# Patient Record
Sex: Female | Born: 1950 | Race: Black or African American | Hispanic: No | Smoking: Current every day smoker
Health system: Southern US, Community
[De-identification: ages and names within clinical notes are randomized; demographics above are authoritative.]

## PROBLEM LIST (undated history)

## (undated) DIAGNOSIS — I499 Cardiac arrhythmia, unspecified: Secondary | ICD-10-CM

## (undated) DIAGNOSIS — I2510 Atherosclerotic heart disease of native coronary artery without angina pectoris: Secondary | ICD-10-CM

## (undated) DIAGNOSIS — M1990 Unspecified osteoarthritis, unspecified site: Secondary | ICD-10-CM

## (undated) DIAGNOSIS — K219 Gastro-esophageal reflux disease without esophagitis: Secondary | ICD-10-CM

## (undated) DIAGNOSIS — I10 Essential (primary) hypertension: Secondary | ICD-10-CM

## (undated) DIAGNOSIS — Z5189 Encounter for other specified aftercare: Secondary | ICD-10-CM

## (undated) DIAGNOSIS — F329 Major depressive disorder, single episode, unspecified: Secondary | ICD-10-CM

## (undated) DIAGNOSIS — G5600 Carpal tunnel syndrome, unspecified upper limb: Secondary | ICD-10-CM

## (undated) DIAGNOSIS — F32A Depression, unspecified: Secondary | ICD-10-CM

## (undated) DIAGNOSIS — Z9889 Other specified postprocedural states: Secondary | ICD-10-CM

## (undated) DIAGNOSIS — I509 Heart failure, unspecified: Secondary | ICD-10-CM

## (undated) DIAGNOSIS — C801 Malignant (primary) neoplasm, unspecified: Secondary | ICD-10-CM

## (undated) DIAGNOSIS — R112 Nausea with vomiting, unspecified: Secondary | ICD-10-CM

## (undated) DIAGNOSIS — IMO0001 Reserved for inherently not codable concepts without codable children: Secondary | ICD-10-CM

## (undated) HISTORY — PX: OTHER SURGICAL HISTORY: SHX169

## (undated) HISTORY — PX: BREAST SURGERY: SHX581

## (undated) HISTORY — PX: ABDOMINAL HYSTERECTOMY: SHX81

## (undated) HISTORY — PX: TOTAL KNEE ARTHROPLASTY: SHX125

## (undated) HISTORY — DX: Carpal tunnel syndrome, unspecified upper limb: G56.00

## (undated) HISTORY — PX: MASTECTOMY: SHX3

## (undated) HISTORY — PX: PORTACATH PLACEMENT: SHX2246

## (undated) SURGERY — ARTHROSCOPY, KNEE
Anesthesia: General | Laterality: Right

---

## 1997-08-24 ENCOUNTER — Encounter: Admission: RE | Admit: 1997-08-24 | Discharge: 1997-11-22 | Payer: Self-pay | Admitting: Orthopedic Surgery

## 1998-05-27 ENCOUNTER — Other Ambulatory Visit: Admission: RE | Admit: 1998-05-27 | Discharge: 1998-05-27 | Payer: Self-pay | Admitting: Radiology

## 1998-06-07 ENCOUNTER — Encounter (HOSPITAL_BASED_OUTPATIENT_CLINIC_OR_DEPARTMENT_OTHER): Payer: Self-pay | Admitting: General Surgery

## 1998-06-09 ENCOUNTER — Ambulatory Visit (HOSPITAL_COMMUNITY): Admission: RE | Admit: 1998-06-09 | Discharge: 1998-06-10 | Payer: Self-pay | Admitting: General Surgery

## 1998-06-24 ENCOUNTER — Encounter: Admission: RE | Admit: 1998-06-24 | Discharge: 1998-07-23 | Payer: Self-pay | Admitting: General Surgery

## 1998-07-22 ENCOUNTER — Other Ambulatory Visit: Admission: RE | Admit: 1998-07-22 | Discharge: 1998-07-22 | Payer: Self-pay | Admitting: *Deleted

## 1998-07-23 ENCOUNTER — Ambulatory Visit (HOSPITAL_COMMUNITY): Admission: RE | Admit: 1998-07-23 | Discharge: 1998-07-23 | Payer: Self-pay | Admitting: Oncology

## 1998-07-26 ENCOUNTER — Ambulatory Visit (HOSPITAL_COMMUNITY): Admission: RE | Admit: 1998-07-26 | Discharge: 1998-07-26 | Payer: Self-pay | Admitting: General Surgery

## 1998-07-26 ENCOUNTER — Encounter (HOSPITAL_BASED_OUTPATIENT_CLINIC_OR_DEPARTMENT_OTHER): Payer: Self-pay | Admitting: General Surgery

## 1998-10-20 ENCOUNTER — Encounter: Payer: Self-pay | Admitting: Oncology

## 1998-10-20 ENCOUNTER — Inpatient Hospital Stay (HOSPITAL_COMMUNITY): Admission: AD | Admit: 1998-10-20 | Discharge: 1998-10-22 | Payer: Self-pay | Admitting: Oncology

## 1999-01-07 ENCOUNTER — Encounter (HOSPITAL_COMMUNITY): Admission: RE | Admit: 1999-01-07 | Discharge: 1999-04-07 | Payer: Self-pay | Admitting: Oncology

## 1999-02-08 ENCOUNTER — Ambulatory Visit (HOSPITAL_COMMUNITY): Admission: RE | Admit: 1999-02-08 | Discharge: 1999-02-09 | Payer: Self-pay | Admitting: General Surgery

## 1999-03-29 ENCOUNTER — Encounter: Admission: RE | Admit: 1999-03-29 | Discharge: 1999-04-08 | Payer: Self-pay | Admitting: Orthopedic Surgery

## 1999-03-30 ENCOUNTER — Ambulatory Visit (HOSPITAL_COMMUNITY): Admission: RE | Admit: 1999-03-30 | Discharge: 1999-03-30 | Payer: Self-pay | Admitting: Cardiology

## 1999-04-06 ENCOUNTER — Encounter: Payer: Self-pay | Admitting: Cardiology

## 1999-04-06 ENCOUNTER — Encounter: Admission: RE | Admit: 1999-04-06 | Discharge: 1999-04-06 | Payer: Self-pay | Admitting: Cardiology

## 1999-04-11 ENCOUNTER — Ambulatory Visit (HOSPITAL_COMMUNITY): Admission: RE | Admit: 1999-04-11 | Discharge: 1999-04-11 | Payer: Self-pay | Admitting: Cardiology

## 1999-05-31 ENCOUNTER — Encounter: Payer: Self-pay | Admitting: Neurology

## 1999-05-31 ENCOUNTER — Ambulatory Visit (HOSPITAL_COMMUNITY): Admission: RE | Admit: 1999-05-31 | Discharge: 1999-05-31 | Payer: Self-pay | Admitting: Neurology

## 1999-06-15 ENCOUNTER — Encounter: Payer: Self-pay | Admitting: Neurology

## 1999-06-15 ENCOUNTER — Encounter: Admission: RE | Admit: 1999-06-15 | Discharge: 1999-06-15 | Payer: Self-pay | Admitting: Neurology

## 1999-08-08 ENCOUNTER — Other Ambulatory Visit: Admission: RE | Admit: 1999-08-08 | Discharge: 1999-08-08 | Payer: Self-pay | Admitting: Obstetrics and Gynecology

## 1999-10-04 ENCOUNTER — Encounter (HOSPITAL_COMMUNITY): Admission: RE | Admit: 1999-10-04 | Discharge: 2000-01-02 | Payer: Self-pay | Admitting: Dentistry

## 1999-10-24 ENCOUNTER — Encounter: Payer: Self-pay | Admitting: Oncology

## 1999-10-24 ENCOUNTER — Encounter: Admission: RE | Admit: 1999-10-24 | Discharge: 1999-10-24 | Payer: Self-pay | Admitting: Oncology

## 1999-11-14 ENCOUNTER — Encounter: Admission: RE | Admit: 1999-11-14 | Discharge: 2000-02-12 | Payer: Self-pay | Admitting: Family Medicine

## 2000-01-04 ENCOUNTER — Encounter (HOSPITAL_COMMUNITY): Admission: RE | Admit: 2000-01-04 | Discharge: 2000-04-03 | Payer: Self-pay | Admitting: Dentistry

## 2000-05-21 ENCOUNTER — Encounter (HOSPITAL_COMMUNITY): Payer: Self-pay | Admitting: Dentistry

## 2000-05-21 ENCOUNTER — Encounter (HOSPITAL_COMMUNITY): Admission: RE | Admit: 2000-05-21 | Discharge: 2000-08-19 | Payer: Self-pay | Admitting: Dentistry

## 2000-07-07 ENCOUNTER — Emergency Department (HOSPITAL_COMMUNITY): Admission: EM | Admit: 2000-07-07 | Discharge: 2000-07-08 | Payer: Self-pay | Admitting: *Deleted

## 2000-07-07 ENCOUNTER — Encounter: Payer: Self-pay | Admitting: *Deleted

## 2000-07-07 ENCOUNTER — Inpatient Hospital Stay (HOSPITAL_COMMUNITY): Admission: EM | Admit: 2000-07-07 | Discharge: 2000-07-10 | Payer: Self-pay | Admitting: Emergency Medicine

## 2000-07-13 ENCOUNTER — Encounter: Admission: RE | Admit: 2000-07-13 | Discharge: 2000-07-13 | Payer: Self-pay | Admitting: Oncology

## 2000-07-13 ENCOUNTER — Encounter: Payer: Self-pay | Admitting: Oncology

## 2000-07-13 ENCOUNTER — Ambulatory Visit (HOSPITAL_COMMUNITY): Admission: RE | Admit: 2000-07-13 | Discharge: 2000-07-13 | Payer: Self-pay | Admitting: Oncology

## 2000-10-02 ENCOUNTER — Encounter: Payer: Self-pay | Admitting: Cardiology

## 2000-10-02 ENCOUNTER — Inpatient Hospital Stay (HOSPITAL_COMMUNITY): Admission: AD | Admit: 2000-10-02 | Discharge: 2000-10-04 | Payer: Self-pay | Admitting: Cardiology

## 2001-03-01 ENCOUNTER — Encounter (HOSPITAL_COMMUNITY): Admission: RE | Admit: 2001-03-01 | Discharge: 2001-05-30 | Payer: Self-pay | Admitting: Dentistry

## 2001-11-05 ENCOUNTER — Encounter: Admission: RE | Admit: 2001-11-05 | Discharge: 2001-11-05 | Payer: Self-pay | Admitting: Family Medicine

## 2001-11-05 ENCOUNTER — Encounter: Payer: Self-pay | Admitting: Family Medicine

## 2002-08-28 ENCOUNTER — Emergency Department (HOSPITAL_COMMUNITY): Admission: EM | Admit: 2002-08-28 | Discharge: 2002-08-28 | Payer: Self-pay | Admitting: Emergency Medicine

## 2003-10-05 ENCOUNTER — Emergency Department (HOSPITAL_COMMUNITY): Admission: EM | Admit: 2003-10-05 | Discharge: 2003-10-06 | Payer: Self-pay | Admitting: Emergency Medicine

## 2003-12-10 ENCOUNTER — Ambulatory Visit (HOSPITAL_COMMUNITY): Admission: RE | Admit: 2003-12-10 | Discharge: 2003-12-10 | Payer: Self-pay | Admitting: Gastroenterology

## 2004-08-23 ENCOUNTER — Ambulatory Visit: Payer: Self-pay | Admitting: Oncology

## 2005-03-05 ENCOUNTER — Emergency Department (HOSPITAL_COMMUNITY): Admission: EM | Admit: 2005-03-05 | Discharge: 2005-03-05 | Payer: Self-pay | Admitting: Emergency Medicine

## 2005-04-29 ENCOUNTER — Encounter: Payer: Self-pay | Admitting: Vascular Surgery

## 2005-04-29 ENCOUNTER — Ambulatory Visit (HOSPITAL_COMMUNITY): Admission: RE | Admit: 2005-04-29 | Discharge: 2005-04-29 | Payer: Self-pay | Admitting: Family Medicine

## 2005-11-03 ENCOUNTER — Encounter: Admission: RE | Admit: 2005-11-03 | Discharge: 2005-11-03 | Payer: Self-pay | Admitting: Orthopedic Surgery

## 2005-11-20 ENCOUNTER — Encounter (HOSPITAL_COMMUNITY): Admission: RE | Admit: 2005-11-20 | Discharge: 2005-12-20 | Payer: Self-pay | Admitting: Orthopedic Surgery

## 2005-11-21 ENCOUNTER — Emergency Department (HOSPITAL_COMMUNITY): Admission: EM | Admit: 2005-11-21 | Discharge: 2005-11-21 | Payer: Self-pay | Admitting: Emergency Medicine

## 2005-12-07 ENCOUNTER — Inpatient Hospital Stay (HOSPITAL_COMMUNITY): Admission: RE | Admit: 2005-12-07 | Discharge: 2005-12-11 | Payer: Self-pay | Admitting: Orthopedic Surgery

## 2005-12-08 ENCOUNTER — Ambulatory Visit: Payer: Self-pay | Admitting: Physical Medicine & Rehabilitation

## 2007-10-31 ENCOUNTER — Emergency Department (HOSPITAL_COMMUNITY): Admission: EM | Admit: 2007-10-31 | Discharge: 2007-11-01 | Payer: Self-pay | Admitting: Emergency Medicine

## 2009-03-23 ENCOUNTER — Emergency Department (HOSPITAL_COMMUNITY): Admission: EM | Admit: 2009-03-23 | Discharge: 2009-03-23 | Payer: Self-pay | Admitting: Emergency Medicine

## 2009-06-07 ENCOUNTER — Inpatient Hospital Stay (HOSPITAL_COMMUNITY): Admission: EM | Admit: 2009-06-07 | Discharge: 2009-06-10 | Payer: Self-pay | Admitting: Emergency Medicine

## 2010-04-11 LAB — LIPID PANEL
Cholesterol: 177 mg/dL (ref 0–200)
HDL: 54 mg/dL (ref >39)
LDL Cholesterol: 113 mg/dL — ABNORMAL HIGH (ref 0–99)
Total CHOL/HDL Ratio: 3.3 RATIO
Triglycerides: 50 mg/dL (ref <150)

## 2010-04-11 LAB — DIFFERENTIAL
Basophils Absolute: 0 10*3/uL (ref 0.0–0.1)
Basophils Absolute: 0 10*3/uL (ref 0.0–0.1)
Basophils Relative: 0 % (ref 0–1)
Basophils Relative: 0 % (ref 0–1)
Eosinophils Absolute: 0 10*3/uL (ref 0.0–0.7)
Eosinophils Relative: 0 % (ref 0–5)
Lymphocytes Relative: 10 % — ABNORMAL LOW (ref 12–46)
Lymphocytes Relative: 17 % (ref 12–46)
Lymphs Abs: 1.7 10*3/uL (ref 0.7–4.0)
Monocytes Absolute: 0.1 10*3/uL (ref 0.1–1.0)
Monocytes Absolute: 0.5 10*3/uL (ref 0.1–1.0)
Monocytes Absolute: 0.8 10*3/uL (ref 0.1–1.0)
Monocytes Relative: 4 % (ref 3–12)
Neutro Abs: 10.4 10*3/uL — ABNORMAL HIGH (ref 1.7–7.7)
Neutro Abs: 13.6 10*3/uL — ABNORMAL HIGH (ref 1.7–7.7)
Neutrophils Relative %: 73 % (ref 43–77)
Neutrophils Relative %: 85 % — ABNORMAL HIGH (ref 43–77)
Neutrophils Relative %: 87 % — ABNORMAL HIGH (ref 43–77)

## 2010-04-11 LAB — CBC
HCT: 33.6 % — ABNORMAL LOW (ref 36.0–46.0)
HCT: 35.5 % — ABNORMAL LOW (ref 36.0–46.0)
Hemoglobin: 11.8 g/dL — ABNORMAL LOW (ref 12.0–15.0)
Hemoglobin: 12.3 g/dL (ref 12.0–15.0)
Hemoglobin: 12.4 g/dL (ref 12.0–15.0)
MCHC: 34.7 g/dL (ref 30.0–36.0)
MCHC: 35.2 g/dL (ref 30.0–36.0)
MCV: 91.3 fL (ref 78.0–100.0)
Platelets: 226 10*3/uL (ref 150–400)
Platelets: 229 10*3/uL (ref 150–400)
Platelets: 261 10*3/uL (ref 150–400)
RBC: 3.86 MIL/uL — ABNORMAL LOW (ref 3.87–5.11)
RBC: 3.87 MIL/uL (ref 3.87–5.11)
RBC: 3.96 MIL/uL (ref 3.87–5.11)
RDW: 15 % (ref 11.5–15.5)
WBC: 16 10*3/uL — ABNORMAL HIGH (ref 4.0–10.5)

## 2010-04-11 LAB — CARDIAC PANEL(CRET KIN+CKTOT+MB+TROPI)
CK, MB: 1.1 ng/mL (ref 0.3–4.0)
Relative Index: INVALID (ref 0.0–2.5)
Total CK: 63 U/L (ref 7–177)
Total CK: 65 U/L (ref 7–177)
Troponin I: 0.01 ng/mL (ref 0.00–0.06)
Troponin I: 0.01 ng/mL (ref 0.00–0.06)

## 2010-04-11 LAB — BASIC METABOLIC PANEL
BUN: 19 mg/dL (ref 6–23)
BUN: 22 mg/dL (ref 6–23)
CO2: 27 mEq/L (ref 19–32)
CO2: 31 mEq/L (ref 19–32)
Calcium: 9.1 mg/dL (ref 8.4–10.5)
Calcium: 9.5 mg/dL (ref 8.4–10.5)
Chloride: 103 mEq/L (ref 96–112)
Creatinine, Ser: 0.84 mg/dL (ref 0.4–1.2)
Creatinine, Ser: 1 mg/dL (ref 0.4–1.2)
GFR calc Af Amer: >60 mL/min (ref >60)
GFR calc Af Amer: >60 mL/min (ref >60)
GFR calc non Af Amer: 53 mL/min — ABNORMAL LOW (ref >60)
GFR calc non Af Amer: >60 mL/min (ref >60)
Glucose, Bld: 180 mg/dL — ABNORMAL HIGH (ref 70–99)
Potassium: 3.7 mEq/L (ref 3.5–5.1)
Sodium: 141 mEq/L (ref 135–145)

## 2010-04-11 LAB — GLUCOSE, CAPILLARY
Glucose-Capillary: 193 mg/dL — ABNORMAL HIGH (ref 70–99)
Glucose-Capillary: 270 mg/dL — ABNORMAL HIGH (ref 70–99)
Glucose-Capillary: 272 mg/dL — ABNORMAL HIGH (ref 70–99)
Glucose-Capillary: 308 mg/dL — ABNORMAL HIGH (ref 70–99)
Glucose-Capillary: 406 mg/dL — ABNORMAL HIGH (ref 70–99)
Glucose-Capillary: 460 mg/dL — ABNORMAL HIGH (ref 70–99)

## 2010-04-11 LAB — BRAIN NATRIURETIC PEPTIDE: Pro B Natriuretic peptide (BNP): <30 pg/mL (ref 0.0–100.0)

## 2010-04-11 LAB — MAGNESIUM: Magnesium: 2.1 mg/dL (ref 1.5–2.5)

## 2010-04-11 LAB — HEMOGLOBIN A1C
Hgb A1c MFr Bld: 7.7 % — ABNORMAL HIGH (ref <5.7)
Mean Plasma Glucose: 174 mg/dL — ABNORMAL HIGH (ref <117)

## 2010-04-11 LAB — GLUCOSE, RANDOM: Glucose, Bld: 403 mg/dL — ABNORMAL HIGH (ref 70–99)

## 2010-06-10 NOTE — Op Note (Signed)
[name] [name], [name] [name]               ACCOUNT NO.:  [id number]   MEDICAL RECORD NO.:  [id number]          PATIENT TYPE:  INP   LOCATION:  2550                         FACILITY:  [facility]   PHYSICIAN:  Myrtie Neither, MD      DATE OF BIRTH:  08/16/1950   DATE OF PROCEDURE:  12/07/2005  DATE OF DISCHARGE:                                 OPERATIVE REPORT   PREOPERATIVE DIAGNOSIS:  Severe degenerative arthritis left knee.   POSTOPERATIVE DIAGNOSIS:  Severe degenerative arthritis left knee.   ANESTHESIA:  General.   PROCEDURE:  Left total knee arthroplasty, Biomet implant.   The patient was taken to the operating room after being given adequate preop  medications.  Given general anesthesia and intubated.  The left knee was  prepped with DuraPrep and draped in sterile manner.  Tourniquet and Bovie  were used for hemostasis.  Anterior midline incision made from the tibial  tuberosity up to the quadriceps through the skin, going through the  subcutaneous, down to the fascia.  Sharp and blunt dissection were made both  medially about the capsule.  A medial paramedian incision was made into the  capsule, extending from the tibial tuberosity up to the quadriceps.  Due to  varus deformity, medial soft tissue release was necessary.  Patella was  reflected laterally.  The knee was taken up into a flexed position.  Osteophytes about the patella, femur, and tibia were resected.  The patient  had both deficiency of ACL as well as PCL.  After adequate soft tissue  release, with the knee in a flexed position, tibial cutting guide was put in  place, and 10 mm of tibial surface was resected.  Next, reaming down the  femoral canal was done. Distal femoral cutting jig was put in place, and  distal femoral cut was made.  Sizer was then put in place, and femoral size  was 65 mm.  Appropriate cutting jig for the 65 mm component was put in  place.  Anterior and posterior cuts and chamfering cuts were done.   Other  soft tissue resection was also done.  Due to deficiency of the PCL, a PCL-  sacrificing component was necessary, and the PCL cutting block was put in  place, and appropriate cut was made.  Next, trial component was put in place  and was found to fit very good and snug.  Attention was turned to the  proximal tibia, which was measured at 67 mm.  Alignment rod was used.  With  a 12 mm poly and the femoral component in place, full extension, full  flexion, landmarks were marked.  Then, appropriate cutting jigs were put in  place for the proximal tibia, and appropriate cuts were made.  Trial  components for femur and tibia were put in place, full extension, full  flexion, good medial and lateral stability.  A 12 mm poly was found to be  most stable.  Attention was then turned to the patella, which was sized at a  medium-size patella but very thin.  Appropriate cutting jig was put in  place, and  care was taken not to overcut the patella due to the thin size  which was 50 mm. A medium thin poly trial patella was put in place, and  range of motion was again tested, full extension, full flexion. No lateral  subluxation of the patella was noted.  Next, all components were removed.  Copious irrigation was done with the Simpulse irrigator. Methyl methacrylate  was mixed.  All three components were cemented.  Excess methyl methacrylate  was removed.  After setting of the cement, trial poly 12 mm was put back in  place, and again full flexion, full extension, good medial and lateral  stability, and without subluxation of the patella.  Hemostasis was then  obtained with the Bovie.  Further irrigation was done followed by locking of  the 12  mm poly in place. Wound closure was then done with 0 Vicryl for the fascia,  2-0 for the subcutaneous, and skin staples for the skin.  Bulky compressive  dressing was applied, and knee immobilizer applied.  The patient tolerated  the procedure quite well and  went to the recovery room in stable and  satisfactory condition.      Myrtie Neither, MD  Electronically Signed     AC/MEDQ  D:  12/07/2005  T:  12/07/2005  Job:  [id number]

## 2010-06-10 NOTE — Discharge Summary (Signed)
[place]. [facility]  Patient:    [name] [name], [name] [name]                      MRN: [id number] Adm. Date:  [id number] Disc. Date: 07/10/00 Attending:  Darlin Priestly Dictator:   Mancel Bale, P.A. CC:         Valentino Hue. Magrinat, M.D. with oncology   Discharge Summary  ADMISSION DIAGNOSES:  1. Congestive heart failure/pulmonary edema.  2. Atypical chest pain.  3. History of hypertension.  4. Non-insulin-dependent diabetes mellitus.  5. Irritable bowel syndrome.  6. History of left breast cancer status post left radical mastectomy.  7. History of Adriamycin - induced cardiomyopathy.  8. History of cardiac catheterization March 2001 that revealed normal     coronary arteries and ejection fraction 35%.  9. Ongoing tobacco use.  DISCHARGE DIAGNOSES:  1. Congestive heart failure/pulmonary edema, resolved.  2. Atypical chest pain.  3. Status post echocardiogram on July 09, 2000 which revealed overall left     ventricular systolic function mildly decreased.  Left ventricular     ejection fraction estimated 40-50% with mild hypokinesis of the     posterolateral wall.  There was no valvular abnormality on echocardiogram.     No other significant findings on echocardiogram.  4. History of hypertension.  5. Non-insulin-dependent diabetes mellitus.  6. Irritable bowel syndrome.  7. History of left breast cancer status post left radical mastectomy.  8. History of Adriamycin - induced cardiomyopathy.  9. History of cardiac catheterization March 2001 that revealed normal     coronary arteries and ejection fraction 35%. 10. Ongoing tobacco use.  HISTORY OF PRESENT ILLNESS:  [name] [name] is a 60 year old black female with a history of hypertension, non-insulin-dependent diabetes mellitus, irritable bowel syndrome, and a history of left breast cancer status post left mastectomy with presumed Adriamycin - induced LV dysfunction and history of EF of 35%.  She  presented to [facility] on July 07, 2000 with complaints of increased shortness of breath and dyspnea on exertion and persistent chest pain.  She had a history of cardiac catheterization March 2001 which revealed normal coronary arteries and EF 35%.  She had a negative Cardiolite prior to catheterization secondary to chest pain as well.  She continues to smoke one pack per day.  She presented with complaints of shortness of breath and dyspnea.  She stated that two weeks ago she started having increased dyspnea on exertion which had markedly worsened over the last three days.  She also had complaints of lower extremity edema, orthopnea, and PND.  At [facility] chest x-ray showed CHF and she was given IV Lasix with a greater than 2 liter diuresis and improved symptoms.  However, she continued to complain with chest pain with nonischemic EKG.  She was then transferred to [facility] for further evaluation.  At that point she was seen by Dr. Lenise Herald and was admitted for heart failure.  He planned for gentle diuresis, continue Coreg and ARB. He would add digoxin.  He would check cyclic enzymes but doubted that chest pain was cardiac in nature given normal coronaries one year ago.  He felt that she may need a repeat echo to assess LV function.  HOSPITAL COURSE:  On July 08, 2000 she continued to have complaints of chest wall pain that seemed positional in nature.  She describes it as a discomfort going from her right chest all the  way through to her back.  It does change when she changes position in the bed.  Thus far, the cardiac enzymes were negative.  We planned for a repeat echo on Monday and she was continued on gentle diuresis.  On July 09, 2000 she was continuing to have intermittent chest pain that continued to be positional and as described previously.  Her cardiac enzymes were negative x 3.  She awaited echocardiogram.  We had ordered a mammogram  to evaluate her to rule out any recurrent breast CA as the etiology of her chest pain but were informed that these are only done at [facility].  We then called Dr. Princella Pellegrini office for consultation regarding their input in evaluating this chest discomfort and ruling out any recurrent cancer as etiology of her chest pain.  Later on July 09, 2000 echocardiogram was performed and showed normal valvular structures.  It also showed EF to be 40-50% with mild hypokinesis of the posterolateral wall.  On July 10, 2000 it was felt that she was stable for discharge home from a cardiac standpoint.  Her lungs have been clear and she has had no lower extremity edema, and her echo shows that her EF actually is much improved over the last year, and there were no other significant findings on echo.  Her enzymes have remained negative and her EKGs remain without change.  I have spoken with Dr. Darnelle Catalan on the phone.  He does not feel that an inpatient consult is necessary and does not feel that any immediate studies need to be ordered.  Rather, he will plan to see her back in the office in one to two weeks and reevaluate her at that time, and review her chart and decide at that point what studies, if any, need to be performed regarding her chest pain.  HOSPITAL CONSULTS:  None.  HOSPITAL PROCEDURES:  Echocardiogram performed on July 09, 2000 shows left ventricular ejection fraction 40-50%.  Mild hypokinesis of the posterolateral wall.  Left ventricular wall thickness mildly increased.  Right ventricle mildly to moderately dilated.  Right ventricular systolic function mildly reduced.  There are no valvular abnormalities.  EKG shows normal sinus rhythm, nonspecific ST-T changes throughout the hospital.  There were never any ischemic or acute changes.  LABORATORY DATA:  Lipid profile July 08, 2000 shows cholesterol 180, triglyceride 121, HDL 46, LDL 110.  Cardiac enzymes show CKs of 82 and 70,  MB 0.3 and 0.7, troponin 0.01 x 2.  Metabolic profile is normal with sodium 144,  potassium 3.5, BUN 21, creatinine 0.7, chloride 109, CO2 29, glucose 113. LFTs are all normal with total protein 6.1, albumin 3.5, AST 14, ALT 12, alkaline phosphatase 55, total bili 0.5.  CBC also normal with wbcs 5.6, hemoglobin 11.9, hematocrit 35.2, platelets 226.  PT 13.1, INR 1.0.  DISCHARGE MEDICATIONS: 1. Avandia 4 mg once a day. 2. Prevacid 30 mg once a day. 3. Diovan HCTZ 160/12.5 once a day. 4. Celebrex 200 mg once a day. 5. Wellbutrin 150 mg twice a day. 6. Enteric-coated aspirin 325 mg once a day. 7. Coreg 12.5 mg twice a day. 8. Arimidex 1 mg as before. 9. Lasix 20 mg as needed for swelling or shortness of breath.  DIET:  We have reviewed a low salt diet.  Also low fat diabetic diet.  FOLLOW-UP: 1. She is to call Dr. Princella Pellegrini office to make an appointment to see him in    the next one to two weeks.  I  have explained the necessity for this and    she understands. 2. She has an appointment to follow up with Dr. Elsie Lincoln in the office July 5 at    2:30 p.m. DD:  07/10/00 TD:  07/10/00 Job: [id number]

## 2010-06-10 NOTE — Discharge Summary (Signed)
NAMEMACKINSEY, PELLAND               ACCOUNT NO.:  [id number]   MEDICAL RECORD NO.:  [id number]          PATIENT TYPE:  INP   LOCATION:  5014                         FACILITY:  [facility]   PHYSICIAN:  Myrtie Neither, MD      DATE OF BIRTH:  07/09/1950   DATE OF ADMISSION:  12/07/2005  DATE OF DISCHARGE:  12/11/2005                               DISCHARGE SUMMARY   ADMISSION DIAGNOSIS:  Severe degenerative arthropathy, left knee;  history of breast cancer; history of hypertension; past history of  asthma; history of reflux.   DISCHARGE DIAGNOSIS:  Severe degenerative arthropathy, left knee;  history of breast cancer; history of hypertension; past history of  asthma; history of reflux.   COMPLICATIONS:  None.   INFECTIONS:  None.   OPERATIONS:  Left total knee arthroplasty done 12/07/2005.   PERTINENT HISTORY:  This is a 60 year old female followed in the office  for severe degenerative joint disease of both knees, left knee being  worse than the right.  The patient had been treated with anti-  inflammatories, therapeutic injections and the use of a cane.  The  patient's condition was to the extent that the patient's left knee was  buckling and giving, swelling persistent pain, even with use of  medication.  Pertinent physical revealed left knee varus deformity, +2  effusion, unstable knee, both AP and lateral, crepitus all three  compartments negative Homan's test, range of motion was limited.  X-rays  revealed sclerosis both medial and lateral compartment and  patellofemoral joint.  The patient had preop laboratory, CBC, EKG, chest  x-ray, urinalysis, CMET, PT/PTT and platelet count which was found to be  stable enough for the patient to undergo surgery.  The patient had preop  medical evaluation by Renaye Rakers, M.D. that was found to be stable.  The patient underwent left total knee arthroplasty, tolerated procedure  quite well.  Postop course fairly benign.  Had pre and postop IV  antibiotics, used a CPM, home health and occupational therapy, physical  therapy.  The patient progressed well with ambulation with partial  weightbearing on the left side with use of walker and was able to be  discharged home on Percocet one to two by mouth every four hours as  needed for pain, home PT and CPM, continued postop Coumadin therapy.  The patient stable to be discharged to return to office in one week.      Myrtie Neither, MD  Electronically Signed     AC/MEDQ  D:  02/01/2006  T:  02/01/2006  Job:  [id number]

## 2010-06-10 NOTE — Op Note (Signed)
[name] [name], [name] [name]               ACCOUNT NO.:  [id number]   MEDICAL RECORD NO.:  [id number]          PATIENT TYPE:  AMB   LOCATION:  ENDO                         FACILITY:  [facility]   PHYSICIAN:  Jordan Hawks. Elnoria Howard, MD    DATE OF BIRTH:  1950/01/26   DATE OF PROCEDURE:  12/10/2003  DATE OF DISCHARGE:                                 OPERATIVE REPORT   PROCEDURE:  Colonoscopy.   INDICATIONS FOR PROCEDURE:  Screening colonoscopy.   CONSENT:  Informed consent was explained to the patient describing the risks  of bleeding, infection, perforation, medication reaction, 10% miss rate for  small colon cancer and polyps, the risks of death, all of which are not  exclusive of any other complications that may occur.   PHYSICAL EXAMINATION:  Cardiac:  Regular rate and rhythm.  Lungs clear to  auscultation bilaterally.  Abdomen:  Obese, soft, nontender, nondistended.   MEDICATIONS GIVEN:  Versed 5 mg IV and Demerol 40 mg IV and Phenergan 12.5  mg IV.   DESCRIPTION OF PROCEDURE:  The patient was placed in the left lateral  decubitus position and after adequate sedation was achieved, a rectal exam  was performed which was negative for any palpable abnormalities.  The  colonoscope was inserted from the anus and advanced to the cecum under  direct visualization without difficulty.  The patient was noted to have a  very good prep.  Upon slow withdrawal of the colonoscope, there is no  evidence of any masses, ulcerations, polyps, erosions, or vascular  abnormalities in the cecum, ascending colon, transverse, descending, or  sigmoid colon.  Close inspection did reveal that the patient did have  multiple small diverticula scattered throughout the colon, however, they are  mostly concentrated in the sigmoid colon.  With retroflexion in the rectum,  there is evidence of large internal and external hemorrhoids.  The  colonoscope was then straightened and withdrawn from the patient, no  complications were  encountered.  The patient tolerated the procedure well.   PLAN:  Repeat colonoscopy in ten years.       PDH/MEDQ  D:  12/10/2003  T:  12/10/2003  Job:  [id number]   cc:   Renaye Rakers, M.D.  [phone], [address]  [place]  [place] [address]  Fax: [phone]

## 2010-06-10 NOTE — Discharge Summary (Signed)
[place]. [facility]  Patient:    [name] [name], [name] [name] Visit Number: [id number] MRN: [id number]          Service Type: MED Location: [phone] Attending Physician:  Ophelia Shoulder Dictated by:   Marya Fossa, P.A. Admit Date:  10/02/2000 Disc. Date: 10/04/00   CC:         Geraldo Pitter, M.D.   Discharge Summary  DATE OF BIRTH:  03/15/1950.  [facility] MEDICAL RECORD NUMBER:  [id number].  ADMISSION DIAGNOSES: 1. Congestive heart failure. 2. Hypotension. 3. 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 4. Diabetes. 5. Arthritis.  DISCHARGE DIAGNOSES: 1. Congestive heart failure - compensated and stable. 2. Hypotension - resolved. 3. 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 4. Diabetes. 5. Arthritis. 6. Hypokalemia - repleted. 7. Sinus congestion - Being treated.  HISTORY OF PRESENT ILLNESS:  [name] [name] is a 60 year old African-American female with history of patent coronaries and Adriamycin induced cardiomyopathy who was referred to Madaline Savage, M.D., for hypotension with a blood pressure of 86/50 on September 27, 2000, by Geraldo Pitter, M.D.  Today, (October 01, 2000), she is short of breath with exertion and continues to have lower BP.  She has some fatigue and weakness.  She is mildly orthostatic in the office today with a lying pressure of 101/70, sitting 104/70, and standing 90/78.  Pulse 75.  Lungs have rales bilaterally. Extremities with trace lower extremity edema.  EKG showed sinus rhythm at rate of 75, and nonspecific T-wave changes.  The patient will be admitted to [facility]. [facility] for IV inotropic therapy.  Will follow labs.  She will need IV diuresis.  PROCEDURES:  None.  COMPLICATIONS:  None.  CONSULTATIONS:  None.  HOSPITAL COURSE:  [name] [name] was admitted to the hospital actually on October 02, 2000, for inotropic and diuretic therapy.  She  was treated with IV Lasix 40 mg q.12h. and IV dobutamine.  Lanoxin loading was starting.  Admission laboratory studies revealed the wbc of 5.4, hemoglobin 10.8, platelets 233.  INR 1.0. Potassium 3.1, BUN 19, creatinine 0.9.  LFTs within normal limits.  TSH 0.749.  The patients potassium was repleted.  A BNP level was drawn and was only 22. Digoxin level was 1.0 on October 03, 2000.  The patient improved symptomatology during her hospital stay.  Weight was down 0.13 pounds in the first 24 hours.  She had average urine output.  It was felt that the patient was only in early failure if anything, but improved on dobutamine symptomatically.  BNP level was not consistent with acute LV decompensation.  The patient did develop symptoms of sore throat and sinus congestion during the hospital stay. She also had a low grade temperature of 100.2 starting on October 03, 2000, which was down to 99 on October 04, 2000.  Throat cultures were negative for Strep. She had urine cultures which were negative. It is felt that she has a viral upper respiratory tract infection.  Dobutamine was weaned and discontinued on October 04, 2000.  Potassium was stable at 4.1, BUN 20, creatinine 0.7.  The patient was felt stable for discharge home on October 04, 2000.  DISCHARGE MEDICATIONS:  1. Claritin one a day until congestion improved then as needed.  2. Lanoxin 0.25 mg a day.  3. Lasix 40 mg a day.  4. Potassium 20 mEq a day.  4. Coreg 12.5 mg b.i.d.  5. Celebrex 200 mg b.i.d.  6.  Wellbutrin 150 mg b.i.d.  7. Avandia 4 mg a day.  8. Arimidex 1 mg a day.  9. Prevacid 30 mg a day. 10. Aspirin 325 a day. 11. Ativan, Darvocet, Neurontin, and glucophage as before. 12. Diovan 80 mg a day.  ACTIVITY:  As tolerated.  DIET:  No added salt.  Low fat, low cholesterol diabetic diet.  DISCHARGE INSTRUCTIONS:  Call the office if any problems or questions.  FOLLOW-UP:  A follow-up appointment has  been scheduled with Darcella Gasman. Annie Paras, F.N.P.C., on October 17, 2000, at 11 oclock.  We have given the patient a lab slip to have a BNP and digoxin level drawn in the middle of next week to make sure her potassium levels are stable and her digoxin levels are stable on this new Lanoxin. Dictated by:   Marya Fossa, P.A. Attending Physician:  Ophelia Shoulder DD:  10/04/00 TD:  10/04/00 Job: [id number]

## 2010-06-10 NOTE — Cardiovascular Report (Signed)
[place]. [name]  Patient:    [name] [name]                      MRN: [id number] Proc. Date: 04/11/99 Adm. Date:  [id number] Attending:  Ophelia Shoulder CC:         Cardiac Catheterization Laboratory             Geraldo Pitter, M.D.                        Cardiac Catheterization  PROCEDURE PERFORMED: 1. Selective coronary angiography by Judkins technique. 2. Retrograde left heart catheterization. 3. Left ventricular angiography.  COMPLICATIONS:  None.  ENTRY SITE:  Right femoral.  DYE USED:  Omnipaque.  MEDICATIONS GIVEN PRIOR TO CATHETERIZATION LABORATORY ENTRY:  The patient was given contrast dye allergy prophylaxis in the form of Solu-Medrol 60 mg IV, Peptic 20 mg IV, and Benadryl 25 mg IV.  PATIENT PROFILE:  The patient is a 60 year old woman who has had shortness of breath, chest pain, left ventricular dysfunction, probably related to Adriamycin cardiotoxicity, who has recently had an outpatient nuclear medicine stress test  showing a depressed left ventricular ejection fraction of 42%, and no definite evidence of ischemia.  Because of continued chest pain, however, despite the use of Zantac and Celebrex, the patient was brought to the cardiac catheterization laboratory for diagnostic cardiac catheterization.  RESULTS: Left ventricular pressure:  150/25. Central aortic pressure:  150/95, with a mean of 115.  ANGIOGRAPHIC RESULTS: 1. Left main coronary artery:  The left main coronary artery was widely patent    and normal. 2. Left anterior descending coronary artery:  The left anterior descending coursed    to the cardiac apex and gave rise to a septal perforator branch very near    the origin of the left main itself.  A diagonal branch arose in the midportion    of the vessel.  No lesions were seen in the LAD, diagonal, or septal    perforator branch. 3. Left circumflex coronary artery:  The left circumflex was  nondominant.    It gave rise to one very large intermediate ramus branch which bifurcated    distally, then two smaller obtuse marginal branches.  All branches of the    circumflex and ramus were patent and normal. 4. Right coronary artery:  The right coronary artery contained what was a large    and dominant vessel, giving rise to both the posterior descending and the    posterior lateral branch.  No lesions were seen. 5. Left ventricle:  Showed mild left ventricular dilatation and global    hypokinesis, with an ejection fraction estimated at 35% plus or minus 5%.  FINAL DIAGNOSES: 1. Angiographic patent coronary arteries. 2. Adriamycin cardiotoxicity with dilated cardiomyopathy, left ventricular    ejection fraction of 35%.  PLAN:  The patient should be treated with ACE inhibitors for her left ventricular dysfunction, diuretics when needed.  Beta blockers will be poorly tolerated because of her asthma.  She does not have a clinical indication for digitalis at the current time.  She should be reassured with respect to the chest pain she has been having.  Her shortness of breath is likely attributable to her cardiomyopathy. DD:  04/11/99 TD:  04/11/99 Job: [id number]

## 2010-10-24 LAB — COMPREHENSIVE METABOLIC PANEL
ALT: 14
AST: 14
Albumin: 3.5
Alkaline Phosphatase: 84
Chloride: 108
GFR calc Af Amer: >60
Potassium: 3.2 — ABNORMAL LOW
Total Bilirubin: 0.5

## 2010-10-24 LAB — DIFFERENTIAL
Basophils Absolute: 0
Basophils Relative: 0
Eosinophils Relative: 4
Monocytes Absolute: 0.5

## 2010-10-24 LAB — CBC
HCT: 37.3
Platelets: 253
WBC: 7.7

## 2011-04-07 ENCOUNTER — Other Ambulatory Visit: Payer: Self-pay

## 2011-04-07 ENCOUNTER — Encounter (HOSPITAL_COMMUNITY): Payer: Self-pay | Admitting: *Deleted

## 2011-04-07 ENCOUNTER — Emergency Department (HOSPITAL_COMMUNITY): Payer: Medicare Other

## 2011-04-07 ENCOUNTER — Emergency Department (HOSPITAL_COMMUNITY)
Admission: EM | Admit: 2011-04-07 | Discharge: 2011-04-07 | Disposition: A | Discharge status: Home / Self Care | Payer: Medicare Other | Attending: Emergency Medicine | Admitting: Emergency Medicine

## 2011-04-07 DIAGNOSIS — R05 Cough: Secondary | ICD-10-CM | POA: Diagnosis not present

## 2011-04-07 DIAGNOSIS — R0602 Shortness of breath: Secondary | ICD-10-CM | POA: Diagnosis not present

## 2011-04-07 DIAGNOSIS — R000 Tachycardia, unspecified: Secondary | ICD-10-CM | POA: Insufficient documentation

## 2011-04-07 DIAGNOSIS — R079 Chest pain, unspecified: Secondary | ICD-10-CM | POA: Insufficient documentation

## 2011-04-07 DIAGNOSIS — R0989 Other specified symptoms and signs involving the circulatory and respiratory systems: Secondary | ICD-10-CM | POA: Diagnosis not present

## 2011-04-07 DIAGNOSIS — F17200 Nicotine dependence, unspecified, uncomplicated: Secondary | ICD-10-CM | POA: Insufficient documentation

## 2011-04-07 DIAGNOSIS — R002 Palpitations: Secondary | ICD-10-CM | POA: Insufficient documentation

## 2011-04-07 DIAGNOSIS — R059 Cough, unspecified: Secondary | ICD-10-CM | POA: Insufficient documentation

## 2011-04-07 HISTORY — DX: Atherosclerotic heart disease of native coronary artery without angina pectoris: I25.10

## 2011-04-07 HISTORY — DX: Unspecified osteoarthritis, unspecified site: M19.90

## 2011-04-07 HISTORY — DX: Malignant (primary) neoplasm, unspecified: C80.1

## 2011-04-07 HISTORY — DX: Heart failure, unspecified: I50.9

## 2011-04-07 HISTORY — DX: Essential (primary) hypertension: I10

## 2011-04-07 LAB — BASIC METABOLIC PANEL
BUN: 12 mg/dL (ref 6–23)
Calcium: 9.7 mg/dL (ref 8.4–10.5)
Creatinine, Ser: 0.78 mg/dL (ref 0.50–1.10)
GFR calc Af Amer: >90 mL/min (ref >90)
GFR calc non Af Amer: 88 mL/min — ABNORMAL LOW (ref >90)
Glucose, Bld: 154 mg/dL — ABNORMAL HIGH (ref 70–99)
Potassium: 3.3 mEq/L — ABNORMAL LOW (ref 3.5–5.1)

## 2011-04-07 LAB — CBC
Hemoglobin: 13.7 g/dL (ref 12.0–15.0)
MCH: 31 pg (ref 26.0–34.0)
MCHC: 34.3 g/dL (ref 30.0–36.0)
MCV: 90.5 fL (ref 78.0–100.0)

## 2011-04-07 LAB — DIFFERENTIAL
Basophils Relative: 0 % (ref 0–1)
Eosinophils Absolute: 0 10*3/uL (ref 0.0–0.7)
Eosinophils Relative: 1 % (ref 0–5)
Monocytes Relative: 15 % — ABNORMAL HIGH (ref 3–12)
Neutrophils Relative %: 64 % (ref 43–77)

## 2011-04-07 LAB — POCT I-STAT TROPONIN I

## 2011-04-07 MED ORDER — SODIUM CHLORIDE 0.9 % IV BOLUS (SEPSIS)
500.0000 mL | Freq: Once | INTRAVENOUS | Status: AC
  Administered 2011-04-07: 500 mL via INTRAVENOUS

## 2011-04-07 MED ORDER — METOPROLOL TARTRATE 1 MG/ML IV SOLN
INTRAVENOUS | Status: AC
  Administered 2011-04-07: 5 mg via INTRAVENOUS
  Filled 2011-04-07: qty 15

## 2011-04-07 MED ORDER — ATENOLOL 25 MG PO TABS: 50.0000 mg | ORAL_TABLET | Freq: Every day | ORAL | Status: DC

## 2011-04-07 MED ORDER — BENZONATATE 100 MG PO CAPS: 100.0000 mg | ORAL_CAPSULE | Freq: Three times a day (TID) | ORAL | Status: AC

## 2011-04-07 MED ORDER — METOPROLOL TARTRATE 1 MG/ML IV SOLN
5.0000 mg | Freq: Once | INTRAVENOUS | Status: DC
  Administered 2011-04-07: 5 mg via INTRAVENOUS

## 2011-04-07 NOTE — ED Notes (Signed)
Pt states that her heart has been racing off and on since yesterday. Pt states that she feels really bad. Pt c/o some shortness of breath. States that her chest feels tight. States that her MD put her on Benicar HCT on Wednesday. Did not take it today.

## 2011-04-07 NOTE — Discharge Instructions (Signed)
Your chest x-ray does not show pneumonia.  Your EKG, and blood tests do not show any signs of a heart attack.  You do not have evidence of a blood clot.  Use Tessalon for cough.  Stop smoking.  Use atenolol to control your heart rate, and blood pressure.  Stop your Benicar.  Followup with your physician, for reevaluation.  Return for worse or uncontrolled symptoms

## 2011-04-07 NOTE — ED Notes (Signed)
Pt showing NSR with rate 82 on cardiac monitor. Pt denies pain at this time.

## 2011-04-07 NOTE — ED Provider Notes (Signed)
This chart was scribed for [name] Guppy, MD by Williemae Natter. The patient was seen in room APA14/APA14 at 10:45 AM.  CSN: [id number]  Arrival date & time 04/07/11  1021   First MD Initiated Contact with Patient 04/07/11 1043      Chief Complaint  Patient presents with  . Palpitations    (Consider location/radiation/quality/duration/timing/severity/associated sxs/prior treatment) Patient is a 61 y.o. female presenting with palpitations.  Palpitations  This is a new problem. The current episode started 6 to 12 hours ago. The problem occurs constantly. The problem has been gradually improving. The problem is associated with an unknown factor. Associated symptoms include chest pressure and cough. Pertinent negatives include no fever, no chest pain, no irregular heartbeat, no nausea, no vomiting, no leg pain, no weakness, no shortness of breath and no sputum production. She has tried nothing for the symptoms.   [name] [name] is a 61 y.o. female who presents to the Emergency Department complaining of a rapid heart rate that started yesterday. Pt says that she is not feeling well. Pt reports that she got up several times in the night to check pulse and found that it was elevated to about 190. She denies pain anywhere. Pt reports some chest tightness rated a 7/10. Pt denies fever, nausea, vomiting, diarrhea, light headedness, shortness of breath, blood in stool, bleeding from gums, no leg swelling, and any recent travel. Pt has not had any recent surgery. Pt has a non productive cough. Pt is a smoker.    No past medical history on file.  No past surgical history on file.  No family history on file.  History  Substance Use Topics  . Smoking status: Not on file  . Smokeless tobacco: Not on file  . Alcohol Use: Not on file    OB History    No data available      Review of Systems  Constitutional: Negative for fever.  Respiratory: Positive for cough. Negative for sputum production  and shortness of breath.   Cardiovascular: Positive for palpitations. Negative for chest pain.  Gastrointestinal: Negative for nausea and vomiting.  Neurological: Negative for weakness.   10 Systems reviewed and are negative for acute change except as noted in the HPI.  Allergies  Review of patient's allergies indicates not on file.  Home Medications  No current outpatient prescriptions on file.  BP 100/75  Pulse 174  Temp(Src) 98.4 F (36.9 C) (Oral)  Resp 20  SpO2 93%  Physical Exam  Nursing note and vitals reviewed. Constitutional: She is oriented to person, place, and time. She appears well-developed and well-nourished.  HENT:  Head: Normocephalic and atraumatic.  Eyes: Conjunctivae are normal. Pupils are equal, round, and reactive to light.  Neck: Normal range of motion. Neck supple.  Cardiovascular: Normal heart sounds.  Tachycardia present.   Pulmonary/Chest: Effort normal and breath sounds normal.  Musculoskeletal: Normal range of motion. She exhibits no tenderness.  Neurological: She is alert and oriented to person, place, and time.  Skin: Skin is warm and dry.  Psychiatric: She has a normal mood and affect. Her behavior is normal.    ED Course  Procedures (including critical care time) 61 year old, female, with a history of hypertension, who smokes cigarettes, presents emergency department with a rapid heartbeat.  Nonproductive cough, and not feeling well since last night.  She does have chest tightness, which I think is attributable to her rapid heartbeat.  The chest tightness resolved with 5 mg of Lopressor.  She has  no risk factors for a pulmonary embolism.  Although she did have a history of breast cancer, which was treated 12 years ago.  I will perform a chest x-ray, since she is a nonproductive cough, looking for pneumonia, as well as laboratory testing, including a d-dimer, and cardiac enzymes, to try to determine an etiology for her tachycardia. DIAGNOSTIC  STUDIES: Oxygen Saturation is 93% on room air, adequate by my interpretation.    COORDINATION OF CARE:  Medications  sodium chloride 0.9 % bolus 500 mL (500 mL Intravenous Given 04/07/11 1046)  metoprolol (LOPRESSOR) 1 MG/ML injection (5 mg Intravenous Given 04/07/11 1044)      Labs Reviewed  POCT I-STAT TROPONIN I  CBC  DIFFERENTIAL  BASIC METABOLIC PANEL  D-DIMER, QUANTITATIVE   No results found.   No diagnosis found.  10:58 AM Pt was given 5 mg of lopressor and heart rate has dropped to 80. Pt is feeling better.  ED ECG REPORT   Date: 04/07/2011  EKG Time: 11:48 AM  Rate: 172  Rhythm: sinus tachycardia,    Axis: left  Intervals:none  ST&T Change: nonspecific  Narrative Interpretation: st with nonspecific st changes  ED ECG REPORT   Date: 04/07/2011  EKG Time: 11:49 AM  Rate: 80  Rhythm: normal sinus rhythm,  Rate slower  Axis: left  Intervals:none  ST&T Change: nonspecific tw changes  Narrative Interpretation: nsr with nonspecific tw changes  ( rate decreased after lopressor )     12:33 PM sxs still resolved                 MDM  Tachycardia - resolved. No signs acs or pe. No pneumonia. No recent illness. No pain.   I personally performed the services described in this documentation, which was scribed in my presence. The recorded information has been reviewed and considered.        [name] Guppy, MD 04/07/11 1234

## 2011-04-07 NOTE — ED Notes (Signed)
Pt converted to NSR after Metoprolol given. Pt states that she feels better and is no longer having chest tightness.

## 2011-04-17 ENCOUNTER — Encounter (HOSPITAL_COMMUNITY): Payer: Self-pay | Admitting: Pharmacy Technician

## 2011-04-17 ENCOUNTER — Other Ambulatory Visit: Payer: Self-pay | Admitting: Orthopedic Surgery

## 2011-04-18 ENCOUNTER — Encounter (HOSPITAL_COMMUNITY)
Admission: RE | Admit: 2011-04-18 | Discharge: 2011-04-18 | Disposition: A | Discharge status: Home / Self Care | Payer: Medicare Other | Source: Ambulatory Visit | Attending: Orthopedic Surgery | Admitting: Orthopedic Surgery

## 2011-04-18 ENCOUNTER — Encounter (HOSPITAL_COMMUNITY): Payer: Self-pay

## 2011-04-18 DIAGNOSIS — Z01818 Encounter for other preprocedural examination: Secondary | ICD-10-CM | POA: Insufficient documentation

## 2011-04-18 DIAGNOSIS — Z538 Procedure and treatment not carried out for other reasons: Secondary | ICD-10-CM | POA: Insufficient documentation

## 2011-04-18 DIAGNOSIS — Z01811 Encounter for preprocedural respiratory examination: Secondary | ICD-10-CM | POA: Diagnosis not present

## 2011-04-18 DIAGNOSIS — Z01812 Encounter for preprocedural laboratory examination: Secondary | ICD-10-CM | POA: Insufficient documentation

## 2011-04-18 HISTORY — DX: Cardiac arrhythmia, unspecified: I49.9

## 2011-04-18 HISTORY — DX: Encounter for other specified aftercare: Z51.89

## 2011-04-18 HISTORY — DX: Major depressive disorder, single episode, unspecified: F32.9

## 2011-04-18 HISTORY — DX: Other specified postprocedural states: Z98.890

## 2011-04-18 HISTORY — DX: Nausea with vomiting, unspecified: R11.2

## 2011-04-18 HISTORY — DX: Depression, unspecified: F32.A

## 2011-04-18 HISTORY — DX: Reserved for inherently not codable concepts without codable children: IMO0001

## 2011-04-18 HISTORY — DX: Gastro-esophageal reflux disease without esophagitis: K21.9

## 2011-04-18 LAB — COMPREHENSIVE METABOLIC PANEL
CO2: 24 mEq/L (ref 19–32)
Calcium: 9.3 mg/dL (ref 8.4–10.5)
Creatinine, Ser: 0.69 mg/dL (ref 0.50–1.10)
GFR calc Af Amer: >90 mL/min (ref >90)
GFR calc non Af Amer: >90 mL/min (ref >90)
Glucose, Bld: 112 mg/dL — ABNORMAL HIGH (ref 70–99)
Total Protein: 6.5 g/dL (ref 6.0–8.3)

## 2011-04-18 LAB — CBC
Hemoglobin: 12.8 g/dL (ref 12.0–15.0)
MCH: 31.3 pg (ref 26.0–34.0)
MCHC: 33.5 g/dL (ref 30.0–36.0)
MCV: 93.4 fL (ref 78.0–100.0)
RBC: 4.09 MIL/uL (ref 3.87–5.11)

## 2011-04-18 LAB — URINALYSIS, ROUTINE W REFLEX MICROSCOPIC
Leukocytes, UA: NEGATIVE
Nitrite: NEGATIVE
Specific Gravity, Urine: 1.031 — ABNORMAL HIGH (ref 1.005–1.030)
Urobilinogen, UA: 1 mg/dL (ref 0.0–1.0)

## 2011-04-18 NOTE — Pre-Procedure Instructions (Signed)
20 [name] [name]   04/18/2011   Your procedure is scheduled on:  Thursday, April 4TH    Report to [facility] at  5:30 AM.   Call this number if you have problems the morning of surgery: [phone]   Remember:   Do not eat food:After Midnight Wednesday .  May have clear liquids: up to 4 Hours before arrival time...1:30 AM.  Clear liquids include soda, tea, black coffee, apple or grape juice, broth.   Take these medicines the morning of surgery with A SIP OF WATER:  COREG,               LORCET, ZANTAC   Do not wear jewelry, make-up or nail polish.   Do not wear lotions, powders, or perfumes. You may wear deodorant.   Do not shave 48 hours prior to surgery.   Do not bring valuables to the hospital.   Contacts, dentures or bridgework may not be worn into surgery.  Leave suitcase in the car. After surgery it may be brought to your room.  For patients admitted to the hospital, checkout time is 11:00 AM the day of discharge.   Patients discharged the day of surgery will not be allowed to drive home.  Name and phone number of your driver:            Special Instructions: CHG Shower Use Special Wash: 1/2 bottle night before surgery and 1/2 bottle morning of surgery.   Please read over the following fact sheets that you were given: Pain Booklet, Coughing and Deep Breathing, MRSA Information and Surgical Site Infection Prevention

## 2011-04-18 NOTE — Progress Notes (Addendum)
ACOUPLE OF WEEKS AGO, PT VISITED [facility], D/T HEARTRATE BEING AROUND [phone]....WAS GIVEN LOPRESSOR IV IN THE ER...WAS GIVEN ATENOLOL BY  ER  MD....BUT THEN SHE WENT BACK TO DR BLAND WHO THEN PUT HER ON DIOVAN....NO FURTHER INCIDENTS OF TACHYCARDIA.  1055 Tuesday...DURING PAT APPT., PT STATED SHE HAD SOME PROBLEMS OBTAINING MEDICATIONS D/T COST....I HAVE CALLED DR CARTER'S OFFICE TO NOTIFY HIM THAT AN ORDER FOR CASE MANAGEMENT CONSULT WILL BE NEEDED...NOTE PLACED IN FRONT OF PERMIT ALSO...DA  NO BLOOD DRAWS OR BP'S TO BE TAKEN ON LEFT ARM.Marland KitchenMarland KitchenAPPROPRIATE WRIST BAND PLACED INSIDE CHART....DA

## 2011-04-26 MED ORDER — CEFAZOLIN SODIUM-DEXTROSE 2-3 GM-% IV SOLR: 2.0000 g | INTRAVENOUS | Status: DC

## 2011-04-26 MED ORDER — CHLORHEXIDINE GLUCONATE 4 % EX LIQD: 60.0000 mL | Freq: Once | CUTANEOUS | Status: DC

## 2011-04-27 ENCOUNTER — Encounter (HOSPITAL_COMMUNITY): Admission: RE | Payer: Self-pay | Source: Ambulatory Visit

## 2011-04-27 ENCOUNTER — Ambulatory Visit (HOSPITAL_COMMUNITY)
Admission: RE | Admit: 2011-04-27 | Payer: PRIVATE HEALTH INSURANCE | Source: Ambulatory Visit | Admitting: Orthopedic Surgery

## 2011-04-27 SURGERY — ARTHROSCOPY, KNEE
Anesthesia: General | Laterality: Right

## 2011-08-17 ENCOUNTER — Ambulatory Visit (HOSPITAL_COMMUNITY)
Admission: RE | Admit: 2011-08-17 | Discharge: 2011-08-17 | Disposition: A | Discharge status: Home / Self Care | Payer: Medicare Other | Source: Ambulatory Visit | Attending: Family Medicine | Admitting: Family Medicine

## 2011-08-17 ENCOUNTER — Other Ambulatory Visit (HOSPITAL_COMMUNITY): Payer: Self-pay | Admitting: *Deleted

## 2011-08-17 DIAGNOSIS — Z0271 Encounter for disability determination: Secondary | ICD-10-CM

## 2011-08-17 DIAGNOSIS — Z96659 Presence of unspecified artificial knee joint: Secondary | ICD-10-CM | POA: Diagnosis not present

## 2011-08-17 DIAGNOSIS — M25569 Pain in unspecified knee: Secondary | ICD-10-CM | POA: Diagnosis not present

## 2011-08-17 DIAGNOSIS — R937 Abnormal findings on diagnostic imaging of other parts of musculoskeletal system: Secondary | ICD-10-CM | POA: Insufficient documentation

## 2011-08-17 DIAGNOSIS — M1710 Unilateral primary osteoarthritis, unspecified knee: Secondary | ICD-10-CM | POA: Diagnosis not present

## 2012-07-29 ENCOUNTER — Encounter: Payer: Self-pay | Admitting: Internal Medicine

## 2013-02-01 IMAGING — CR DG KNEE COMPLETE 4+V*L*
4 series · 4 of 4 positions shown · non-contrast
Comparison: 11/03/2005

CLINICAL DATA: Bilateral knee pain.  History of left knee
replacement.

LEFT KNEE - COMPLETE 4+ VIEW

[view not recorded (1 of 4)]
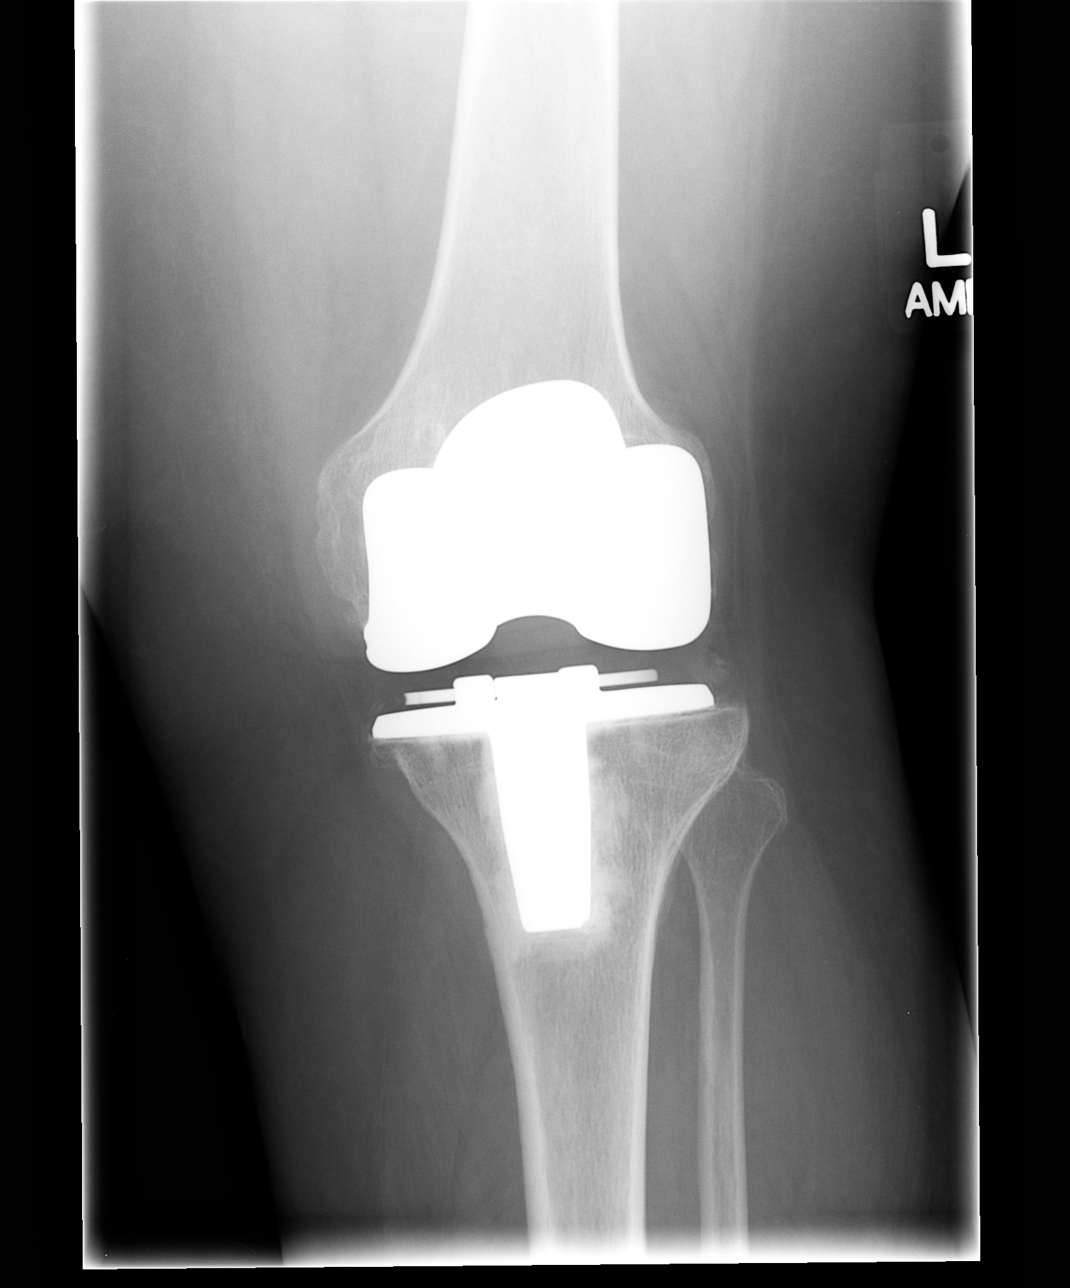

[view not recorded (2 of 4)]
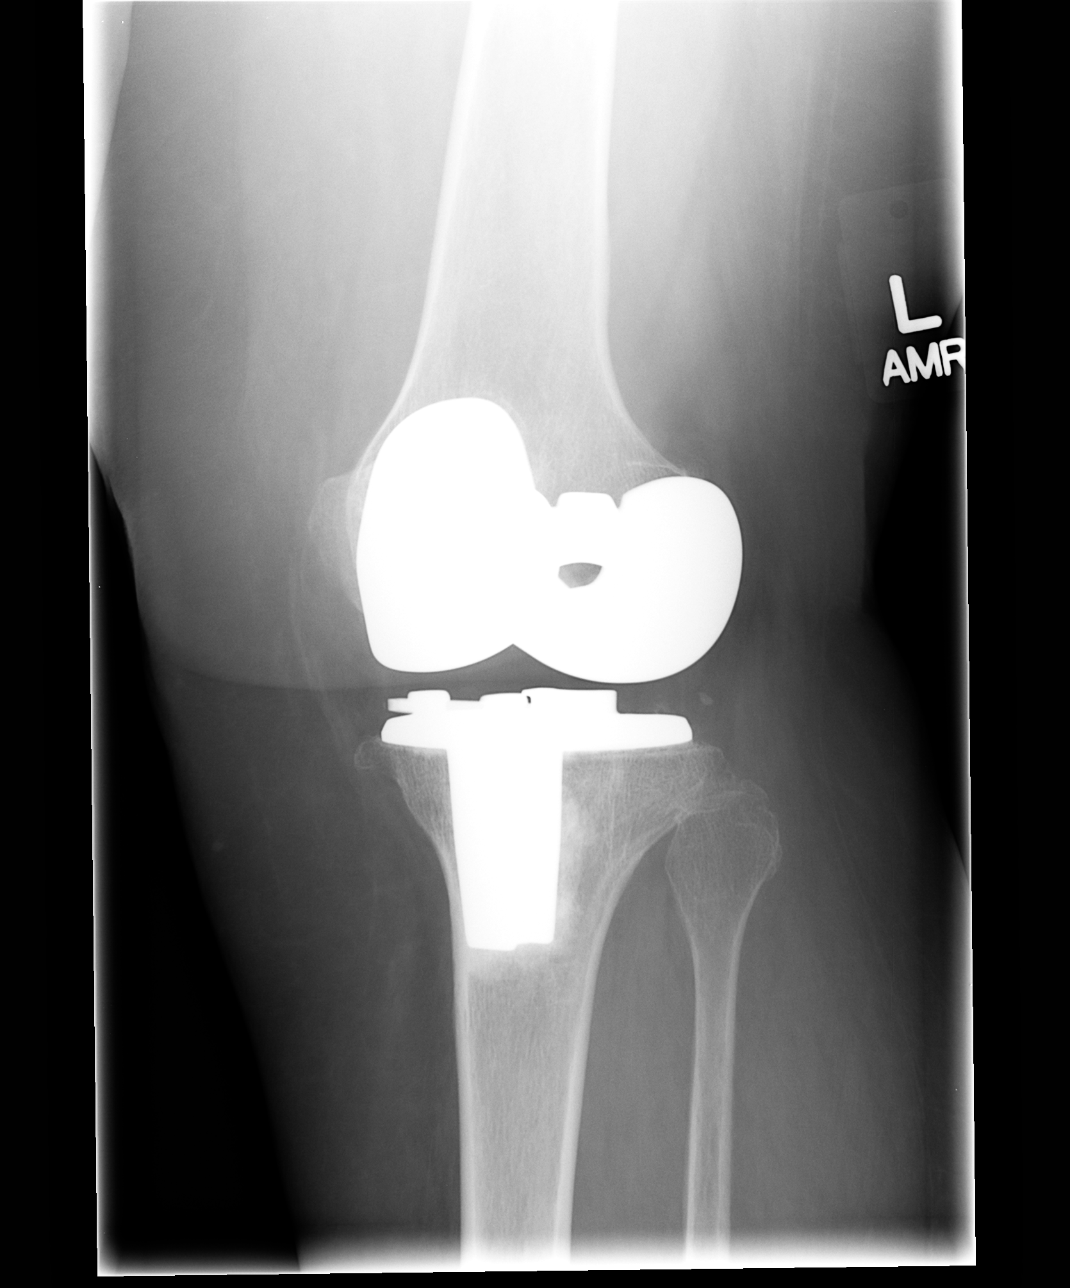

[view not recorded (3 of 4)]
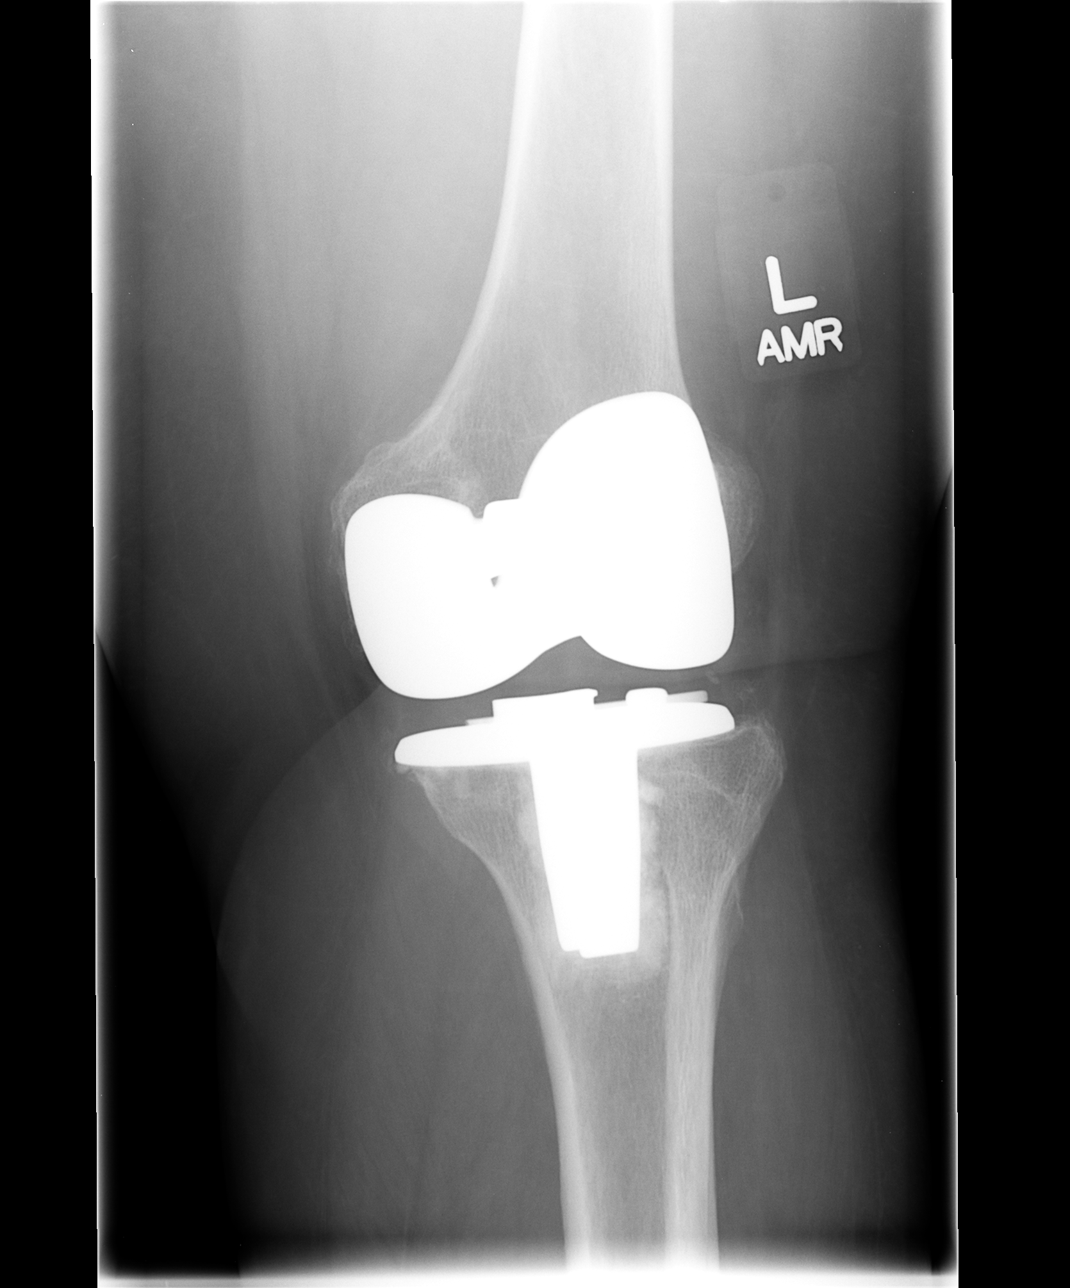

[view not recorded (4 of 4)]
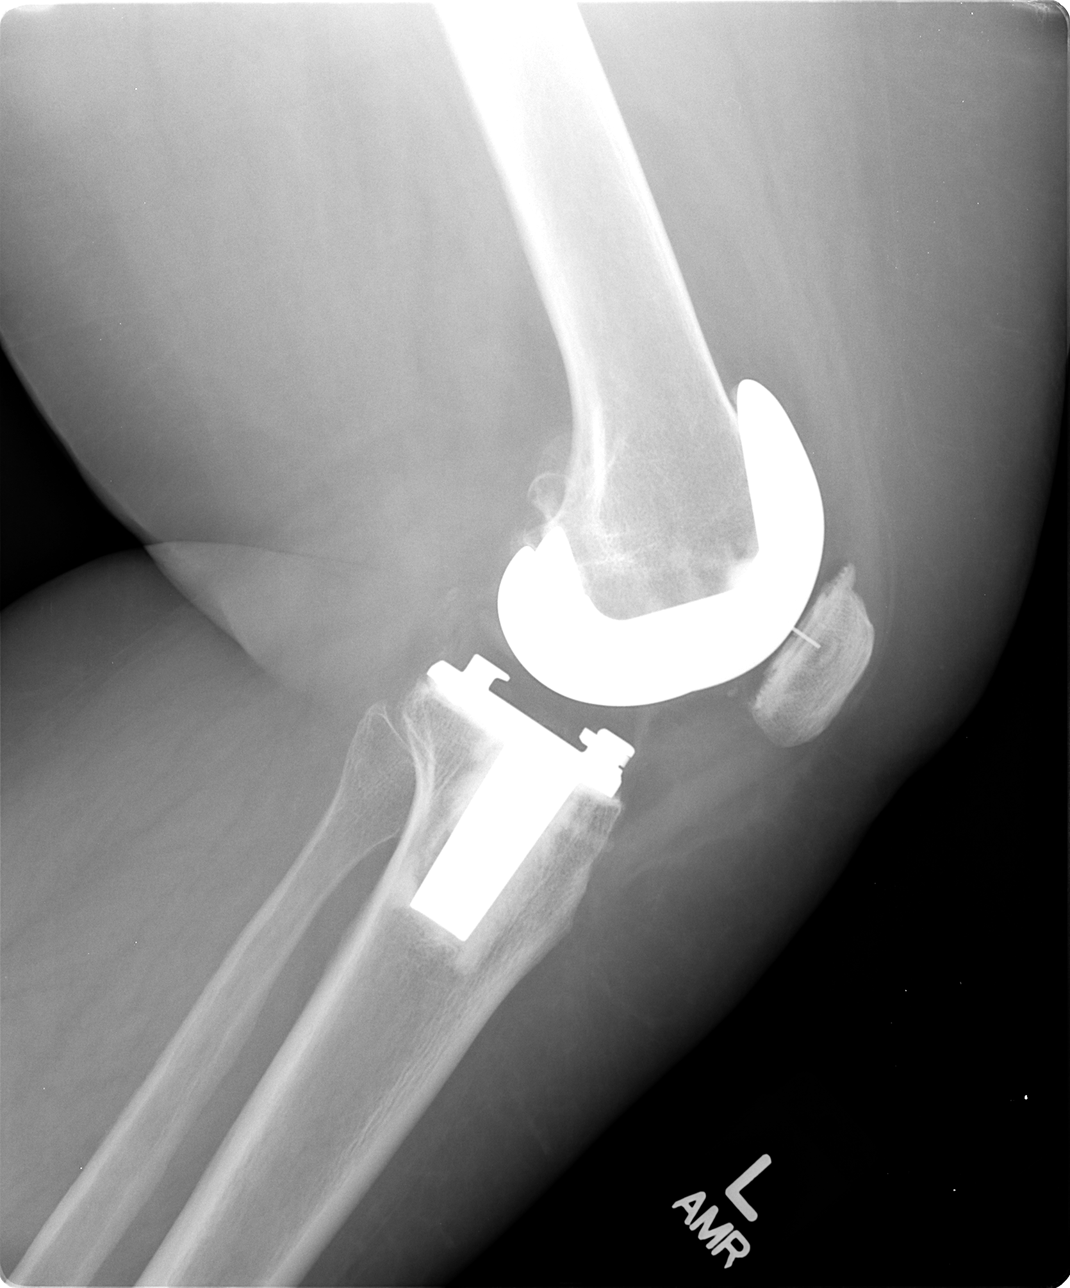

[4 of 4 positions shown; findings below may reference images not displayed]

FINDINGS: Four views of the left knee demonstrate a total knee
arthroplasty.  The knee is located without acute fracture or
dislocation.  No gross soft tissue abnormalities.
IMPRESSION: Left knee replacement without acute findings.

## 2013-05-01 DIAGNOSIS — N182 Chronic kidney disease, stage 2 (mild): Secondary | ICD-10-CM | POA: Diagnosis not present

## 2013-05-01 DIAGNOSIS — E539 Vitamin B deficiency, unspecified: Secondary | ICD-10-CM | POA: Diagnosis not present

## 2013-05-01 DIAGNOSIS — E785 Hyperlipidemia, unspecified: Secondary | ICD-10-CM | POA: Diagnosis not present

## 2013-05-01 DIAGNOSIS — E1129 Type 2 diabetes mellitus with other diabetic kidney complication: Secondary | ICD-10-CM | POA: Diagnosis not present

## 2013-05-01 DIAGNOSIS — E1165 Type 2 diabetes mellitus with hyperglycemia: Secondary | ICD-10-CM | POA: Diagnosis not present

## 2013-05-01 DIAGNOSIS — I10 Essential (primary) hypertension: Secondary | ICD-10-CM | POA: Diagnosis not present

## 2013-05-30 DIAGNOSIS — I10 Essential (primary) hypertension: Secondary | ICD-10-CM | POA: Diagnosis not present

## 2013-05-30 DIAGNOSIS — E119 Type 2 diabetes mellitus without complications: Secondary | ICD-10-CM | POA: Diagnosis not present

## 2013-06-10 DIAGNOSIS — Z111 Encounter for screening for respiratory tuberculosis: Secondary | ICD-10-CM | POA: Diagnosis not present

## 2013-08-29 DIAGNOSIS — I519 Heart disease, unspecified: Secondary | ICD-10-CM | POA: Diagnosis not present

## 2013-08-29 DIAGNOSIS — E119 Type 2 diabetes mellitus without complications: Secondary | ICD-10-CM | POA: Diagnosis not present

## 2013-08-29 DIAGNOSIS — I10 Essential (primary) hypertension: Secondary | ICD-10-CM | POA: Diagnosis not present

## 2013-11-06 DIAGNOSIS — J398 Other specified diseases of upper respiratory tract: Secondary | ICD-10-CM | POA: Diagnosis not present

## 2013-11-06 DIAGNOSIS — H68003 Unspecified Eustachian salpingitis, bilateral: Secondary | ICD-10-CM | POA: Diagnosis not present

## 2013-11-06 DIAGNOSIS — E119 Type 2 diabetes mellitus without complications: Secondary | ICD-10-CM | POA: Diagnosis not present

## 2013-11-19 DIAGNOSIS — Z471 Aftercare following joint replacement surgery: Secondary | ICD-10-CM | POA: Diagnosis not present

## 2013-11-19 DIAGNOSIS — Z96651 Presence of right artificial knee joint: Secondary | ICD-10-CM | POA: Diagnosis not present

## 2013-11-28 DIAGNOSIS — M25569 Pain in unspecified knee: Secondary | ICD-10-CM | POA: Diagnosis not present

## 2013-12-30 DIAGNOSIS — I10 Essential (primary) hypertension: Secondary | ICD-10-CM | POA: Diagnosis not present

## 2013-12-30 DIAGNOSIS — E119 Type 2 diabetes mellitus without complications: Secondary | ICD-10-CM | POA: Diagnosis not present

## 2014-01-13 DIAGNOSIS — M79671 Pain in right foot: Secondary | ICD-10-CM | POA: Diagnosis not present

## 2014-01-13 DIAGNOSIS — L84 Corns and callosities: Secondary | ICD-10-CM | POA: Diagnosis not present

## 2014-01-13 DIAGNOSIS — I739 Peripheral vascular disease, unspecified: Secondary | ICD-10-CM | POA: Diagnosis not present

## 2014-01-13 DIAGNOSIS — E1151 Type 2 diabetes mellitus with diabetic peripheral angiopathy without gangrene: Secondary | ICD-10-CM | POA: Diagnosis not present

## 2014-01-13 DIAGNOSIS — M79672 Pain in left foot: Secondary | ICD-10-CM | POA: Diagnosis not present

## 2014-01-13 DIAGNOSIS — L603 Nail dystrophy: Secondary | ICD-10-CM | POA: Diagnosis not present

## 2014-01-19 DIAGNOSIS — M2010 Hallux valgus (acquired), unspecified foot: Secondary | ICD-10-CM | POA: Diagnosis not present

## 2014-01-19 DIAGNOSIS — E0840 Diabetes mellitus due to underlying condition with diabetic neuropathy, unspecified: Secondary | ICD-10-CM | POA: Diagnosis not present

## 2014-01-19 DIAGNOSIS — M19079 Primary osteoarthritis, unspecified ankle and foot: Secondary | ICD-10-CM | POA: Diagnosis not present

## 2014-01-19 DIAGNOSIS — M2040 Other hammer toe(s) (acquired), unspecified foot: Secondary | ICD-10-CM | POA: Diagnosis not present

## 2014-02-17 DIAGNOSIS — Z853 Personal history of malignant neoplasm of breast: Secondary | ICD-10-CM | POA: Diagnosis not present

## 2014-03-26 DIAGNOSIS — L84 Corns and callosities: Secondary | ICD-10-CM | POA: Diagnosis not present

## 2014-03-26 DIAGNOSIS — E1151 Type 2 diabetes mellitus with diabetic peripheral angiopathy without gangrene: Secondary | ICD-10-CM | POA: Diagnosis not present

## 2014-03-26 DIAGNOSIS — L603 Nail dystrophy: Secondary | ICD-10-CM | POA: Diagnosis not present

## 2014-03-26 DIAGNOSIS — I739 Peripheral vascular disease, unspecified: Secondary | ICD-10-CM | POA: Diagnosis not present

## 2014-03-30 DIAGNOSIS — M25569 Pain in unspecified knee: Secondary | ICD-10-CM | POA: Diagnosis not present

## 2014-04-29 DIAGNOSIS — I10 Essential (primary) hypertension: Secondary | ICD-10-CM | POA: Diagnosis not present

## 2014-04-29 DIAGNOSIS — E119 Type 2 diabetes mellitus without complications: Secondary | ICD-10-CM | POA: Diagnosis not present

## 2014-06-08 DIAGNOSIS — M25569 Pain in unspecified knee: Secondary | ICD-10-CM | POA: Diagnosis not present

## 2014-06-09 DIAGNOSIS — Z111 Encounter for screening for respiratory tuberculosis: Secondary | ICD-10-CM | POA: Diagnosis not present

## 2014-06-11 DIAGNOSIS — I739 Peripheral vascular disease, unspecified: Secondary | ICD-10-CM | POA: Diagnosis not present

## 2014-06-11 DIAGNOSIS — L84 Corns and callosities: Secondary | ICD-10-CM | POA: Diagnosis not present

## 2014-06-11 DIAGNOSIS — L603 Nail dystrophy: Secondary | ICD-10-CM | POA: Diagnosis not present

## 2014-06-11 DIAGNOSIS — E1151 Type 2 diabetes mellitus with diabetic peripheral angiopathy without gangrene: Secondary | ICD-10-CM | POA: Diagnosis not present

## 2014-08-27 DIAGNOSIS — L603 Nail dystrophy: Secondary | ICD-10-CM | POA: Diagnosis not present

## 2014-08-27 DIAGNOSIS — I739 Peripheral vascular disease, unspecified: Secondary | ICD-10-CM | POA: Diagnosis not present

## 2014-08-27 DIAGNOSIS — E1151 Type 2 diabetes mellitus with diabetic peripheral angiopathy without gangrene: Secondary | ICD-10-CM | POA: Diagnosis not present

## 2014-08-27 DIAGNOSIS — L84 Corns and callosities: Secondary | ICD-10-CM | POA: Diagnosis not present

## 2014-09-01 DIAGNOSIS — I10 Essential (primary) hypertension: Secondary | ICD-10-CM | POA: Diagnosis not present

## 2014-09-01 DIAGNOSIS — E119 Type 2 diabetes mellitus without complications: Secondary | ICD-10-CM | POA: Diagnosis not present

## 2014-09-23 DIAGNOSIS — G5601 Carpal tunnel syndrome, right upper limb: Secondary | ICD-10-CM | POA: Diagnosis not present

## 2014-09-23 DIAGNOSIS — E119 Type 2 diabetes mellitus without complications: Secondary | ICD-10-CM | POA: Diagnosis not present

## 2014-09-23 DIAGNOSIS — M810 Age-related osteoporosis without current pathological fracture: Secondary | ICD-10-CM | POA: Diagnosis not present

## 2014-09-23 DIAGNOSIS — I10 Essential (primary) hypertension: Secondary | ICD-10-CM | POA: Diagnosis not present

## 2014-10-09 DIAGNOSIS — M25569 Pain in unspecified knee: Secondary | ICD-10-CM | POA: Diagnosis not present

## 2014-11-04 DIAGNOSIS — Z23 Encounter for immunization: Secondary | ICD-10-CM | POA: Diagnosis not present

## 2014-11-16 DIAGNOSIS — M79672 Pain in left foot: Secondary | ICD-10-CM | POA: Diagnosis not present

## 2014-11-16 DIAGNOSIS — M2040 Other hammer toe(s) (acquired), unspecified foot: Secondary | ICD-10-CM | POA: Diagnosis not present

## 2014-11-16 DIAGNOSIS — M2010 Hallux valgus (acquired), unspecified foot: Secondary | ICD-10-CM | POA: Diagnosis not present

## 2014-11-16 DIAGNOSIS — L603 Nail dystrophy: Secondary | ICD-10-CM | POA: Diagnosis not present

## 2014-11-16 DIAGNOSIS — M79671 Pain in right foot: Secondary | ICD-10-CM | POA: Diagnosis not present

## 2014-11-16 DIAGNOSIS — I739 Peripheral vascular disease, unspecified: Secondary | ICD-10-CM | POA: Diagnosis not present

## 2014-11-16 DIAGNOSIS — E1151 Type 2 diabetes mellitus with diabetic peripheral angiopathy without gangrene: Secondary | ICD-10-CM | POA: Diagnosis not present

## 2014-11-16 DIAGNOSIS — L84 Corns and callosities: Secondary | ICD-10-CM | POA: Diagnosis not present

## 2014-12-21 ENCOUNTER — Encounter: Payer: Medicare Other | Admitting: Neurology

## 2014-12-30 DIAGNOSIS — E119 Type 2 diabetes mellitus without complications: Secondary | ICD-10-CM | POA: Diagnosis not present

## 2014-12-30 DIAGNOSIS — E0843 Diabetes mellitus due to underlying condition with diabetic autonomic (poly)neuropathy: Secondary | ICD-10-CM | POA: Diagnosis not present

## 2014-12-30 DIAGNOSIS — I10 Essential (primary) hypertension: Secondary | ICD-10-CM | POA: Diagnosis not present

## 2014-12-30 DIAGNOSIS — Z6838 Body mass index (BMI) 38.0-38.9, adult: Secondary | ICD-10-CM | POA: Diagnosis not present

## 2015-02-03 ENCOUNTER — Encounter: Payer: Medicare Other | Admitting: Neurology

## 2015-02-08 DIAGNOSIS — I739 Peripheral vascular disease, unspecified: Secondary | ICD-10-CM | POA: Diagnosis not present

## 2015-02-08 DIAGNOSIS — L603 Nail dystrophy: Secondary | ICD-10-CM | POA: Diagnosis not present

## 2015-02-08 DIAGNOSIS — E1151 Type 2 diabetes mellitus with diabetic peripheral angiopathy without gangrene: Secondary | ICD-10-CM | POA: Diagnosis not present

## 2015-02-08 DIAGNOSIS — L84 Corns and callosities: Secondary | ICD-10-CM | POA: Diagnosis not present

## 2015-02-23 ENCOUNTER — Encounter: Payer: Self-pay | Admitting: Neurology

## 2015-02-23 ENCOUNTER — Ambulatory Visit (INDEPENDENT_AMBULATORY_CARE_PROVIDER_SITE_OTHER): Payer: Medicare Other | Admitting: Neurology

## 2015-02-23 ENCOUNTER — Ambulatory Visit (INDEPENDENT_AMBULATORY_CARE_PROVIDER_SITE_OTHER): Payer: Self-pay | Admitting: Neurology

## 2015-02-23 DIAGNOSIS — G5601 Carpal tunnel syndrome, right upper limb: Secondary | ICD-10-CM

## 2015-02-23 DIAGNOSIS — G5600 Carpal tunnel syndrome, unspecified upper limb: Secondary | ICD-10-CM

## 2015-02-23 HISTORY — DX: Carpal tunnel syndrome, unspecified upper limb: G56.00

## 2015-02-23 NOTE — Progress Notes (Signed)
Please refer to EMG and nerve conduction study procedure note. 

## 2015-02-23 NOTE — Procedures (Signed)
     HISTORY:  [name] [name] is a 65 year old patient with a history of diabetes, and prior breast cancer. The patient has developed some numbness and paresthesias and discomfort of the right hand over the last several months. She denies any neck pain or pain radiating down the arm. She is being evaluated for a possible neuropathy or a radiculopathy.  NERVE CONDUCTION STUDIES:  Nerve conduction studies were performed on both upper extremities. The distal motor latencies for the median nerves were prolonged on the right, normal on the left, with a borderline motor amplitude on the right, normal on the left. The distal motor latencies and motor amplitudes for the ulnar nerves were normal bilaterally. The F wave latencies and nerve conduction velocities for the median and ulnar nerves were normal bilaterally. The sensory latencies for the median nerves were prolonged on the right, normal on the left, and normal for the ulnar nerves bilaterally.  EMG STUDIES:  EMG study was performed on the right upper extremity:  The first dorsal interosseous muscle reveals 2 to 4 K units with full recruitment. No fibrillations or positive waves were noted. The abductor pollicis brevis muscle reveals 2 to 6 K units with decreased recruitment. 1 plus fibrillations and positive waves were noted. The extensor indicis proprius muscle reveals 1 to 3 K units with full recruitment. No fibrillations or positive waves were noted. The pronator teres muscle reveals 2 to 3 K units with full recruitment. No fibrillations or positive waves were noted. The biceps muscle reveals 1 to 2 K units with full recruitment. No fibrillations or positive waves were noted. The triceps muscle reveals 2 to 4 K units with full recruitment. No fibrillations or positive waves were noted. The anterior deltoid muscle reveals 2 to 3 K units with full recruitment. No fibrillations or positive waves were noted. The cervical paraspinal muscles were  tested at 2 levels. No abnormalities of insertional activity were seen at either level tested. There was good relaxation.   IMPRESSION:  Nerve conduction studies done on both upper extremities shows evidence of a right carpal tunnel syndrome of moderate severity. No other significant abnormalities were seen. EMG evaluation of the right upper extremity shows acute and chronic denervation in the APB muscle consistent with the diagnosis of carpal tunnel syndrome. No evidence of an overlying cervical radiculopathy was seen.  Jill Alexanders MD 02/23/2015 1:44 PM  [facility] [address] [place], [place] [address]  Phone [phone] Fax [phone]

## 2015-03-03 DIAGNOSIS — Z853 Personal history of malignant neoplasm of breast: Secondary | ICD-10-CM | POA: Diagnosis not present

## 2015-03-03 DIAGNOSIS — Z1231 Encounter for screening mammogram for malignant neoplasm of breast: Secondary | ICD-10-CM | POA: Diagnosis not present

## 2015-03-05 DIAGNOSIS — R922 Inconclusive mammogram: Secondary | ICD-10-CM | POA: Diagnosis not present

## 2015-03-05 DIAGNOSIS — Z1231 Encounter for screening mammogram for malignant neoplasm of breast: Secondary | ICD-10-CM | POA: Diagnosis not present

## 2015-03-05 DIAGNOSIS — R928 Other abnormal and inconclusive findings on diagnostic imaging of breast: Secondary | ICD-10-CM | POA: Diagnosis not present

## 2015-03-05 DIAGNOSIS — Z853 Personal history of malignant neoplasm of breast: Secondary | ICD-10-CM | POA: Diagnosis not present

## 2015-03-08 DIAGNOSIS — B373 Candidiasis of vulva and vagina: Secondary | ICD-10-CM | POA: Diagnosis not present

## 2015-03-23 DIAGNOSIS — Z6837 Body mass index (BMI) 37.0-37.9, adult: Secondary | ICD-10-CM | POA: Diagnosis not present

## 2015-03-23 DIAGNOSIS — E0849 Diabetes mellitus due to underlying condition with other diabetic neurological complication: Secondary | ICD-10-CM | POA: Diagnosis not present

## 2015-03-23 DIAGNOSIS — R21 Rash and other nonspecific skin eruption: Secondary | ICD-10-CM | POA: Diagnosis not present

## 2015-03-23 DIAGNOSIS — I10 Essential (primary) hypertension: Secondary | ICD-10-CM | POA: Diagnosis not present

## 2015-06-17 DIAGNOSIS — Z111 Encounter for screening for respiratory tuberculosis: Secondary | ICD-10-CM | POA: Diagnosis not present

## 2015-07-21 DIAGNOSIS — I10 Essential (primary) hypertension: Secondary | ICD-10-CM | POA: Diagnosis not present

## 2015-07-21 DIAGNOSIS — E0843 Diabetes mellitus due to underlying condition with diabetic autonomic (poly)neuropathy: Secondary | ICD-10-CM | POA: Diagnosis not present

## 2015-09-23 ENCOUNTER — Other Ambulatory Visit: Payer: Self-pay

## 2015-11-02 DIAGNOSIS — H2513 Age-related nuclear cataract, bilateral: Secondary | ICD-10-CM | POA: Diagnosis not present

## 2015-11-02 DIAGNOSIS — H04123 Dry eye syndrome of bilateral lacrimal glands: Secondary | ICD-10-CM | POA: Diagnosis not present

## 2015-11-02 DIAGNOSIS — H47323 Drusen of optic disc, bilateral: Secondary | ICD-10-CM | POA: Diagnosis not present

## 2015-11-02 DIAGNOSIS — E119 Type 2 diabetes mellitus without complications: Secondary | ICD-10-CM | POA: Diagnosis not present

## 2015-11-08 DIAGNOSIS — E089 Diabetes mellitus due to underlying condition without complications: Secondary | ICD-10-CM | POA: Diagnosis not present

## 2015-11-08 DIAGNOSIS — M130 Polyarthritis, unspecified: Secondary | ICD-10-CM | POA: Diagnosis not present

## 2015-11-08 DIAGNOSIS — I10 Essential (primary) hypertension: Secondary | ICD-10-CM | POA: Diagnosis not present

## 2015-11-08 DIAGNOSIS — E0843 Diabetes mellitus due to underlying condition with diabetic autonomic (poly)neuropathy: Secondary | ICD-10-CM | POA: Diagnosis not present

## 2015-11-08 DIAGNOSIS — Z23 Encounter for immunization: Secondary | ICD-10-CM | POA: Diagnosis not present

## 2015-11-11 DIAGNOSIS — M25561 Pain in right knee: Secondary | ICD-10-CM | POA: Diagnosis not present

## 2016-02-28 DIAGNOSIS — E119 Type 2 diabetes mellitus without complications: Secondary | ICD-10-CM | POA: Diagnosis not present

## 2016-02-28 DIAGNOSIS — I10 Essential (primary) hypertension: Secondary | ICD-10-CM | POA: Diagnosis not present

## 2016-02-28 DIAGNOSIS — M130 Polyarthritis, unspecified: Secondary | ICD-10-CM | POA: Diagnosis not present

## 2016-05-12 DIAGNOSIS — E119 Type 2 diabetes mellitus without complications: Secondary | ICD-10-CM | POA: Diagnosis not present

## 2016-05-12 DIAGNOSIS — M130 Polyarthritis, unspecified: Secondary | ICD-10-CM | POA: Diagnosis not present

## 2016-05-12 DIAGNOSIS — I10 Essential (primary) hypertension: Secondary | ICD-10-CM | POA: Diagnosis not present

## 2016-05-16 DIAGNOSIS — Z0000 Encounter for general adult medical examination without abnormal findings: Secondary | ICD-10-CM | POA: Diagnosis not present

## 2016-06-27 DIAGNOSIS — I10 Essential (primary) hypertension: Secondary | ICD-10-CM | POA: Diagnosis not present

## 2016-06-27 DIAGNOSIS — E0843 Diabetes mellitus due to underlying condition with diabetic autonomic (poly)neuropathy: Secondary | ICD-10-CM | POA: Diagnosis not present

## 2016-06-27 DIAGNOSIS — M130 Polyarthritis, unspecified: Secondary | ICD-10-CM | POA: Diagnosis not present

## 2016-06-29 ENCOUNTER — Ambulatory Visit
Admission: RE | Admit: 2016-06-29 | Discharge: 2016-06-29 | Disposition: A | Discharge status: Home / Self Care | Payer: Medicare Other | Source: Ambulatory Visit | Attending: Family Medicine | Admitting: Family Medicine

## 2016-06-29 ENCOUNTER — Other Ambulatory Visit: Payer: Self-pay | Admitting: Family Medicine

## 2016-06-29 DIAGNOSIS — M130 Polyarthritis, unspecified: Secondary | ICD-10-CM

## 2016-06-29 DIAGNOSIS — M19011 Primary osteoarthritis, right shoulder: Secondary | ICD-10-CM | POA: Diagnosis not present

## 2016-08-16 DIAGNOSIS — M1000 Idiopathic gout, unspecified site: Secondary | ICD-10-CM | POA: Diagnosis not present

## 2016-09-13 DIAGNOSIS — M130 Polyarthritis, unspecified: Secondary | ICD-10-CM | POA: Diagnosis not present

## 2016-10-04 DIAGNOSIS — M130 Polyarthritis, unspecified: Secondary | ICD-10-CM | POA: Diagnosis not present

## 2016-11-27 DIAGNOSIS — Z6836 Body mass index (BMI) 36.0-36.9, adult: Secondary | ICD-10-CM | POA: Diagnosis not present

## 2016-11-27 DIAGNOSIS — M1000 Idiopathic gout, unspecified site: Secondary | ICD-10-CM | POA: Diagnosis not present

## 2016-11-27 DIAGNOSIS — G5601 Carpal tunnel syndrome, right upper limb: Secondary | ICD-10-CM | POA: Diagnosis not present

## 2016-11-27 DIAGNOSIS — I10 Essential (primary) hypertension: Secondary | ICD-10-CM | POA: Diagnosis not present

## 2016-11-27 DIAGNOSIS — G5600 Carpal tunnel syndrome, unspecified upper limb: Secondary | ICD-10-CM | POA: Diagnosis not present

## 2016-11-27 DIAGNOSIS — E0843 Diabetes mellitus due to underlying condition with diabetic autonomic (poly)neuropathy: Secondary | ICD-10-CM | POA: Diagnosis not present

## 2016-11-27 DIAGNOSIS — M130 Polyarthritis, unspecified: Secondary | ICD-10-CM | POA: Diagnosis not present

## 2016-11-30 DIAGNOSIS — E119 Type 2 diabetes mellitus without complications: Secondary | ICD-10-CM | POA: Diagnosis not present

## 2016-11-30 DIAGNOSIS — H01024 Squamous blepharitis left upper eyelid: Secondary | ICD-10-CM | POA: Diagnosis not present

## 2016-11-30 DIAGNOSIS — H01022 Squamous blepharitis right lower eyelid: Secondary | ICD-10-CM | POA: Diagnosis not present

## 2016-11-30 DIAGNOSIS — H2513 Age-related nuclear cataract, bilateral: Secondary | ICD-10-CM | POA: Diagnosis not present

## 2016-11-30 DIAGNOSIS — H01025 Squamous blepharitis left lower eyelid: Secondary | ICD-10-CM | POA: Diagnosis not present

## 2016-11-30 DIAGNOSIS — H01021 Squamous blepharitis right upper eyelid: Secondary | ICD-10-CM | POA: Diagnosis not present

## 2016-11-30 DIAGNOSIS — H04123 Dry eye syndrome of bilateral lacrimal glands: Secondary | ICD-10-CM | POA: Diagnosis not present

## 2016-12-28 DIAGNOSIS — G5601 Carpal tunnel syndrome, right upper limb: Secondary | ICD-10-CM | POA: Diagnosis not present

## 2017-01-18 ENCOUNTER — Encounter: Payer: Self-pay | Admitting: Neurology

## 2017-01-25 ENCOUNTER — Ambulatory Visit (INDEPENDENT_AMBULATORY_CARE_PROVIDER_SITE_OTHER): Payer: Medicare HMO | Admitting: Neurology

## 2017-01-25 ENCOUNTER — Other Ambulatory Visit: Payer: Self-pay | Admitting: *Deleted

## 2017-01-25 DIAGNOSIS — R200 Anesthesia of skin: Secondary | ICD-10-CM

## 2017-01-25 NOTE — Procedures (Signed)
[facility]  [address] [place], [address]  [place], [place] [address] Tel: [phone] Fax:  [phone] Test Date:  01/25/2017  Patient: [name] [name] DOB: 12-07-1950 Physician: Narda Amber, DO  Sex: Female Height: 5\' 2"  Ref Phys: Berle Mull, MD  ID#: [id number] Temp: 36.2C Technician:    Patient Complaints: This is a 67 year old female with history of carpal tunnel syndrome referred for evaluation of worsening right hand weakness and paresthesias.   NCV & EMG Findings: Extensive electrodiagnostic testing of the right upper extremity shows:  1. Right median sensory and motor responses are absent. 2. Right ulnar sensory and motor responses are within normal limits. 3. Despite maximal activation, no motor unit recruitment is seen in the right abductor pollicis brevis muscle. There is no evidence of accompanied active denervation involving any of the tested muscles.  Impression: Right median neuropathy at or distal to the wrist, consistent with the clinical diagnosis of carpal tunnel syndrome. Overall, these findings are very severe in degree electrically.   ___________________________ Narda Amber, DO    Nerve Conduction Studies Anti Sensory Summary Table   Site NR Peak (ms) Norm Peak (ms) P-T Amp (V) Norm P-T Amp  Right Median Anti Sensory (2nd Digit)  36.2C  Wrist NR  <3.8  >10  Right Ulnar Anti Sensory (5th Digit)  36.2C  Wrist    2.6 <3.2 11.0 >5   Motor Summary Table   Site NR Onset (ms) Norm Onset (ms) O-P Amp (mV) Norm O-P Amp Site1 Site2 Delta-0 (ms) Dist (cm) Vel (m/s) Norm Vel (m/s)  Right Median Motor (Abd Poll Brev)  36.2C  Wrist NR  <4.0  >5 Elbow Wrist  0.0  >50  Elbow NR            Right Ulnar Motor (Abd Dig Minimi)  36.2C  Wrist    2.0 <3.1 8.1 >7 B Elbow Wrist 3.4 21.0 62 >50  B Elbow    5.4  7.1  A Elbow B Elbow 1.7 10.0 59 >50  A Elbow    7.1  6.8          EMG   Side Muscle Ins Act Fibs Psw Fasc Number Recrt Dur Dur. Amp Amp. Poly  Poly. Comment  Right 1stDorInt Nml Nml Nml Nml Nml Nml Nml Nml Nml Nml Nml Nml N/A  Right Abd Poll Brev Nml Nml Nml Nml NE None - - - - - - ATR  Right PronatorTeres Nml Nml Nml Nml Nml Nml Nml Nml Nml Nml Nml Nml N/A  Right Biceps Nml Nml Nml Nml Nml Nml Nml Nml Nml Nml Nml Nml N/A  Right Triceps Nml Nml Nml Nml Nml Nml Nml Nml Nml Nml Nml Nml N/A  Right Deltoid Nml Nml Nml Nml Nml Nml Nml Nml Nml Nml Nml Nml N/A      Waveforms:

## 2017-02-01 DIAGNOSIS — G5601 Carpal tunnel syndrome, right upper limb: Secondary | ICD-10-CM | POA: Diagnosis not present

## 2017-02-12 DIAGNOSIS — G5601 Carpal tunnel syndrome, right upper limb: Secondary | ICD-10-CM | POA: Diagnosis not present

## 2017-02-12 DIAGNOSIS — I10 Essential (primary) hypertension: Secondary | ICD-10-CM | POA: Diagnosis not present

## 2017-02-12 DIAGNOSIS — E119 Type 2 diabetes mellitus without complications: Secondary | ICD-10-CM | POA: Diagnosis not present

## 2017-02-27 DIAGNOSIS — I10 Essential (primary) hypertension: Secondary | ICD-10-CM | POA: Diagnosis not present

## 2017-02-27 DIAGNOSIS — R9431 Abnormal electrocardiogram [ECG] [EKG]: Secondary | ICD-10-CM | POA: Diagnosis not present

## 2017-02-27 DIAGNOSIS — R079 Chest pain, unspecified: Secondary | ICD-10-CM | POA: Diagnosis not present

## 2017-02-27 DIAGNOSIS — Z01810 Encounter for preprocedural cardiovascular examination: Secondary | ICD-10-CM | POA: Diagnosis not present

## 2017-02-27 DIAGNOSIS — Z8679 Personal history of other diseases of the circulatory system: Secondary | ICD-10-CM | POA: Diagnosis not present

## 2017-03-05 DIAGNOSIS — R9431 Abnormal electrocardiogram [ECG] [EKG]: Secondary | ICD-10-CM | POA: Diagnosis not present

## 2017-03-05 DIAGNOSIS — I10 Essential (primary) hypertension: Secondary | ICD-10-CM | POA: Diagnosis not present

## 2017-03-05 DIAGNOSIS — R0789 Other chest pain: Secondary | ICD-10-CM | POA: Diagnosis not present

## 2017-03-12 DIAGNOSIS — Z8679 Personal history of other diseases of the circulatory system: Secondary | ICD-10-CM | POA: Diagnosis not present

## 2017-03-12 DIAGNOSIS — R079 Chest pain, unspecified: Secondary | ICD-10-CM | POA: Diagnosis not present

## 2017-03-12 DIAGNOSIS — Z01810 Encounter for preprocedural cardiovascular examination: Secondary | ICD-10-CM | POA: Diagnosis not present

## 2017-03-12 DIAGNOSIS — I10 Essential (primary) hypertension: Secondary | ICD-10-CM | POA: Diagnosis not present

## 2017-03-15 DIAGNOSIS — E119 Type 2 diabetes mellitus without complications: Secondary | ICD-10-CM | POA: Diagnosis not present

## 2017-03-15 DIAGNOSIS — G5601 Carpal tunnel syndrome, right upper limb: Secondary | ICD-10-CM | POA: Diagnosis not present

## 2017-03-21 DIAGNOSIS — C50912 Malignant neoplasm of unspecified site of left female breast: Secondary | ICD-10-CM | POA: Diagnosis not present

## 2017-03-28 DIAGNOSIS — G5601 Carpal tunnel syndrome, right upper limb: Secondary | ICD-10-CM | POA: Diagnosis not present

## 2017-04-02 DIAGNOSIS — L603 Nail dystrophy: Secondary | ICD-10-CM | POA: Diagnosis not present

## 2017-04-02 DIAGNOSIS — I739 Peripheral vascular disease, unspecified: Secondary | ICD-10-CM | POA: Diagnosis not present

## 2017-04-02 DIAGNOSIS — E1151 Type 2 diabetes mellitus with diabetic peripheral angiopathy without gangrene: Secondary | ICD-10-CM | POA: Diagnosis not present

## 2017-04-02 DIAGNOSIS — L84 Corns and callosities: Secondary | ICD-10-CM | POA: Diagnosis not present

## 2017-04-04 DIAGNOSIS — C50912 Malignant neoplasm of unspecified site of left female breast: Secondary | ICD-10-CM | POA: Diagnosis not present

## 2017-04-05 DIAGNOSIS — G5601 Carpal tunnel syndrome, right upper limb: Secondary | ICD-10-CM | POA: Diagnosis not present

## 2017-04-11 DIAGNOSIS — G5601 Carpal tunnel syndrome, right upper limb: Secondary | ICD-10-CM | POA: Diagnosis not present

## 2017-04-18 DIAGNOSIS — Z853 Personal history of malignant neoplasm of breast: Secondary | ICD-10-CM | POA: Diagnosis not present

## 2017-04-18 DIAGNOSIS — Z1231 Encounter for screening mammogram for malignant neoplasm of breast: Secondary | ICD-10-CM | POA: Diagnosis not present

## 2017-05-03 DIAGNOSIS — G5601 Carpal tunnel syndrome, right upper limb: Secondary | ICD-10-CM | POA: Diagnosis not present

## 2017-05-14 DIAGNOSIS — Z111 Encounter for screening for respiratory tuberculosis: Secondary | ICD-10-CM | POA: Diagnosis not present

## 2017-05-14 DIAGNOSIS — I10 Essential (primary) hypertension: Secondary | ICD-10-CM | POA: Diagnosis not present

## 2017-05-14 DIAGNOSIS — E0843 Diabetes mellitus due to underlying condition with diabetic autonomic (poly)neuropathy: Secondary | ICD-10-CM | POA: Diagnosis not present

## 2017-05-14 DIAGNOSIS — E119 Type 2 diabetes mellitus without complications: Secondary | ICD-10-CM | POA: Diagnosis not present

## 2017-05-14 DIAGNOSIS — E161 Other hypoglycemia: Secondary | ICD-10-CM | POA: Diagnosis not present

## 2017-05-16 DIAGNOSIS — G5601 Carpal tunnel syndrome, right upper limb: Secondary | ICD-10-CM | POA: Diagnosis not present

## 2017-05-28 DIAGNOSIS — E161 Other hypoglycemia: Secondary | ICD-10-CM | POA: Diagnosis not present

## 2017-05-28 DIAGNOSIS — I10 Essential (primary) hypertension: Secondary | ICD-10-CM | POA: Diagnosis not present

## 2017-07-04 DIAGNOSIS — L603 Nail dystrophy: Secondary | ICD-10-CM | POA: Diagnosis not present

## 2017-07-04 DIAGNOSIS — I739 Peripheral vascular disease, unspecified: Secondary | ICD-10-CM | POA: Diagnosis not present

## 2017-07-04 DIAGNOSIS — E1151 Type 2 diabetes mellitus with diabetic peripheral angiopathy without gangrene: Secondary | ICD-10-CM | POA: Diagnosis not present

## 2017-07-04 DIAGNOSIS — L84 Corns and callosities: Secondary | ICD-10-CM | POA: Diagnosis not present

## 2017-07-09 DIAGNOSIS — Z0000 Encounter for general adult medical examination without abnormal findings: Secondary | ICD-10-CM | POA: Diagnosis not present

## 2017-08-27 DIAGNOSIS — I10 Essential (primary) hypertension: Secondary | ICD-10-CM | POA: Diagnosis not present

## 2017-08-28 DIAGNOSIS — C50912 Malignant neoplasm of unspecified site of left female breast: Secondary | ICD-10-CM | POA: Diagnosis not present

## 2017-09-11 DIAGNOSIS — M21621 Bunionette of right foot: Secondary | ICD-10-CM | POA: Diagnosis not present

## 2017-09-11 DIAGNOSIS — E1142 Type 2 diabetes mellitus with diabetic polyneuropathy: Secondary | ICD-10-CM | POA: Diagnosis not present

## 2017-09-11 DIAGNOSIS — M21612 Bunion of left foot: Secondary | ICD-10-CM | POA: Diagnosis not present

## 2017-09-11 DIAGNOSIS — M21622 Bunionette of left foot: Secondary | ICD-10-CM | POA: Diagnosis not present

## 2017-09-11 DIAGNOSIS — M2041 Other hammer toe(s) (acquired), right foot: Secondary | ICD-10-CM | POA: Diagnosis not present

## 2017-09-11 DIAGNOSIS — M2042 Other hammer toe(s) (acquired), left foot: Secondary | ICD-10-CM | POA: Diagnosis not present

## 2017-09-11 DIAGNOSIS — M21611 Bunion of right foot: Secondary | ICD-10-CM | POA: Diagnosis not present

## 2017-12-15 IMAGING — DX DG SHOULDER 2+V*R*
2 series · 2 of 2 positions shown · non-contrast
Comparison: 10/31/2007 .

CLINICAL DATA: Right shoulder pain.

EXAM:
RIGHT SHOULDER - 2+ VIEW

[dg shoulder right (1 of 2)]
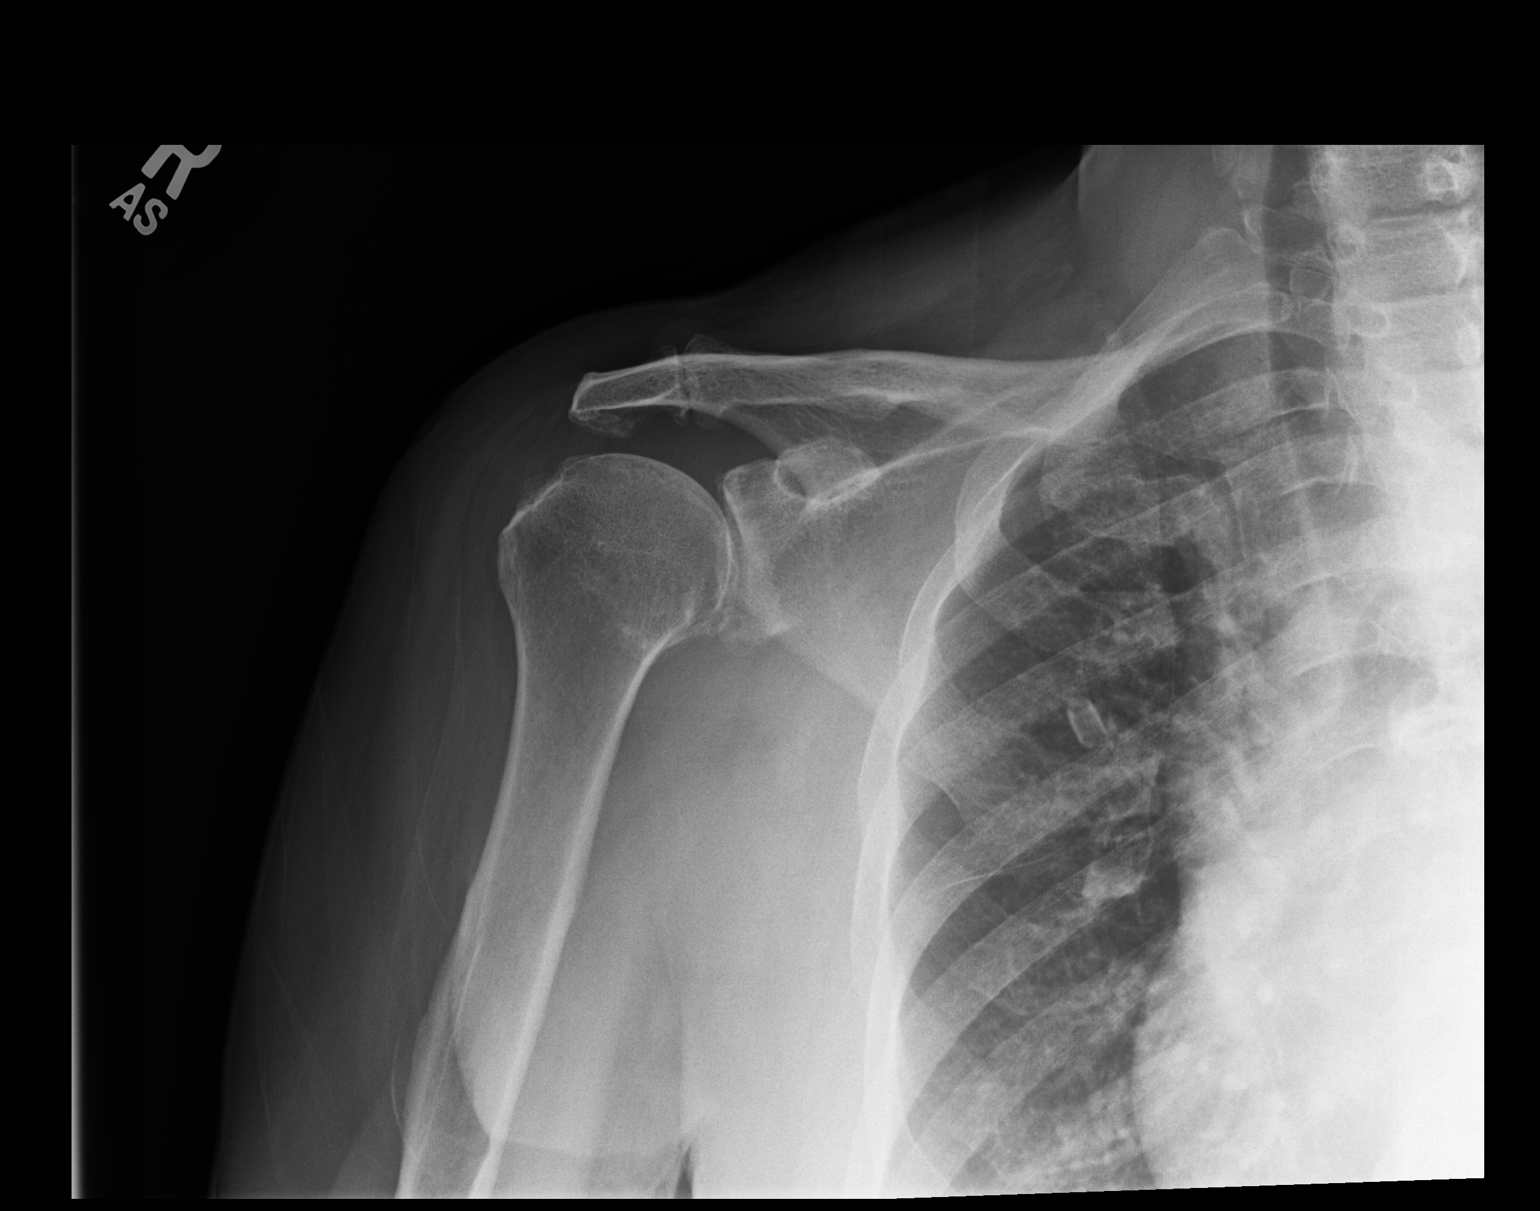

[dg shoulder right (2 of 2)]
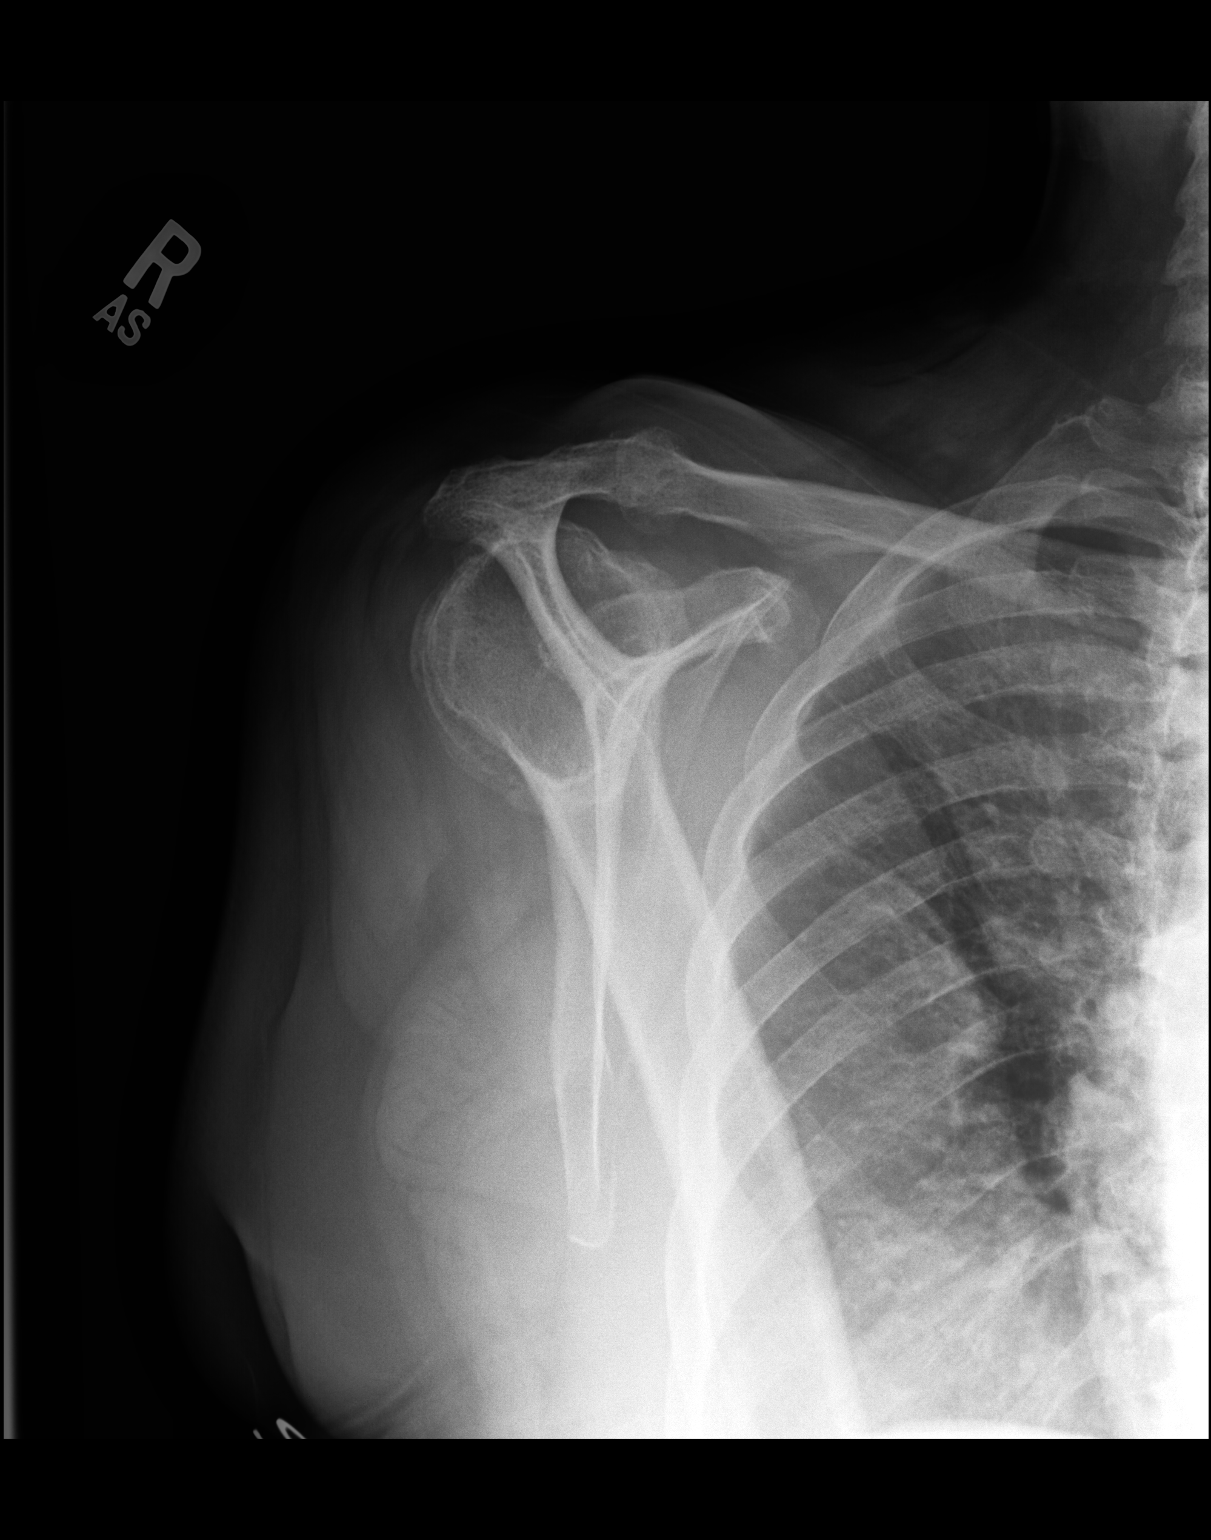

[2 of 2 positions shown; findings below may reference images not displayed]

FINDINGS: Acromioclavicular and glenohumeral degenerative change. Subacromial
spurring. No evidence of fracture dislocation. No acute bony
abnormality identified .
IMPRESSION: Acromioclavicular and glenohumeral degenerative change. Subacromial
spurring. No acute abnormality identified.

## 2018-01-04 DIAGNOSIS — E119 Type 2 diabetes mellitus without complications: Secondary | ICD-10-CM | POA: Diagnosis not present

## 2018-01-04 DIAGNOSIS — M130 Polyarthritis, unspecified: Secondary | ICD-10-CM | POA: Diagnosis not present

## 2018-01-04 DIAGNOSIS — M1000 Idiopathic gout, unspecified site: Secondary | ICD-10-CM | POA: Diagnosis not present

## 2018-01-04 DIAGNOSIS — I10 Essential (primary) hypertension: Secondary | ICD-10-CM | POA: Diagnosis not present

## 2018-01-04 DIAGNOSIS — Z6833 Body mass index (BMI) 33.0-33.9, adult: Secondary | ICD-10-CM | POA: Diagnosis not present

## 2018-03-06 DIAGNOSIS — Z471 Aftercare following joint replacement surgery: Secondary | ICD-10-CM | POA: Diagnosis not present

## 2018-03-06 DIAGNOSIS — Z96651 Presence of right artificial knee joint: Secondary | ICD-10-CM | POA: Diagnosis not present

## 2018-03-06 DIAGNOSIS — G5602 Carpal tunnel syndrome, left upper limb: Secondary | ICD-10-CM | POA: Diagnosis not present

## 2018-03-20 DIAGNOSIS — Z789 Other specified health status: Secondary | ICD-10-CM | POA: Diagnosis not present

## 2018-03-20 DIAGNOSIS — E119 Type 2 diabetes mellitus without complications: Secondary | ICD-10-CM | POA: Diagnosis not present

## 2018-03-20 DIAGNOSIS — F1120 Opioid dependence, uncomplicated: Secondary | ICD-10-CM | POA: Diagnosis not present

## 2018-03-20 DIAGNOSIS — Z9181 History of falling: Secondary | ICD-10-CM | POA: Diagnosis not present

## 2018-03-20 DIAGNOSIS — F329 Major depressive disorder, single episode, unspecified: Secondary | ICD-10-CM | POA: Diagnosis not present

## 2018-03-20 DIAGNOSIS — Z96653 Presence of artificial knee joint, bilateral: Secondary | ICD-10-CM | POA: Diagnosis not present

## 2018-03-20 DIAGNOSIS — M25571 Pain in right ankle and joints of right foot: Secondary | ICD-10-CM | POA: Diagnosis not present

## 2018-03-20 DIAGNOSIS — S8991XA Unspecified injury of right lower leg, initial encounter: Secondary | ICD-10-CM | POA: Diagnosis not present

## 2018-03-20 DIAGNOSIS — Z96641 Presence of right artificial hip joint: Secondary | ICD-10-CM | POA: Diagnosis not present

## 2018-03-20 DIAGNOSIS — S72101E Unspecified trochanteric fracture of right femur, subsequent encounter for open fracture type I or II with routine healing: Secondary | ICD-10-CM | POA: Diagnosis not present

## 2018-03-20 DIAGNOSIS — R279 Unspecified lack of coordination: Secondary | ICD-10-CM | POA: Diagnosis not present

## 2018-03-20 DIAGNOSIS — S7291XD Unspecified fracture of right femur, subsequent encounter for closed fracture with routine healing: Secondary | ICD-10-CM | POA: Diagnosis not present

## 2018-03-20 DIAGNOSIS — R270 Ataxia, unspecified: Secondary | ICD-10-CM | POA: Diagnosis not present

## 2018-03-20 DIAGNOSIS — R2681 Unsteadiness on feet: Secondary | ICD-10-CM | POA: Diagnosis not present

## 2018-03-20 DIAGNOSIS — R269 Unspecified abnormalities of gait and mobility: Secondary | ICD-10-CM | POA: Diagnosis not present

## 2018-03-20 DIAGNOSIS — S99911A Unspecified injury of right ankle, initial encounter: Secondary | ICD-10-CM | POA: Diagnosis not present

## 2018-03-20 DIAGNOSIS — S80911A Unspecified superficial injury of right knee, initial encounter: Secondary | ICD-10-CM | POA: Diagnosis not present

## 2018-03-20 DIAGNOSIS — I959 Hypotension, unspecified: Secondary | ICD-10-CM | POA: Diagnosis not present

## 2018-03-20 DIAGNOSIS — M6281 Muscle weakness (generalized): Secondary | ICD-10-CM | POA: Diagnosis not present

## 2018-03-20 DIAGNOSIS — F339 Major depressive disorder, recurrent, unspecified: Secondary | ICD-10-CM | POA: Diagnosis not present

## 2018-03-20 DIAGNOSIS — M79604 Pain in right leg: Secondary | ICD-10-CM | POA: Diagnosis not present

## 2018-03-20 DIAGNOSIS — Z7984 Long term (current) use of oral hypoglycemic drugs: Secondary | ICD-10-CM | POA: Diagnosis not present

## 2018-03-20 DIAGNOSIS — S72141A Displaced intertrochanteric fracture of right femur, initial encounter for closed fracture: Secondary | ICD-10-CM | POA: Diagnosis not present

## 2018-03-20 DIAGNOSIS — W1830XA Fall on same level, unspecified, initial encounter: Secondary | ICD-10-CM | POA: Diagnosis not present

## 2018-03-20 DIAGNOSIS — Z743 Need for continuous supervision: Secondary | ICD-10-CM | POA: Diagnosis not present

## 2018-03-20 DIAGNOSIS — G4700 Insomnia, unspecified: Secondary | ICD-10-CM | POA: Diagnosis not present

## 2018-03-20 DIAGNOSIS — M25561 Pain in right knee: Secondary | ICD-10-CM | POA: Diagnosis not present

## 2018-03-20 DIAGNOSIS — I10 Essential (primary) hypertension: Secondary | ICD-10-CM | POA: Diagnosis not present

## 2018-03-20 DIAGNOSIS — Z4889 Encounter for other specified surgical aftercare: Secondary | ICD-10-CM | POA: Diagnosis not present

## 2018-03-20 DIAGNOSIS — S72141D Displaced intertrochanteric fracture of right femur, subsequent encounter for closed fracture with routine healing: Secondary | ICD-10-CM | POA: Diagnosis not present

## 2018-03-20 DIAGNOSIS — E118 Type 2 diabetes mellitus with unspecified complications: Secondary | ICD-10-CM | POA: Diagnosis not present

## 2018-03-22 DIAGNOSIS — E118 Type 2 diabetes mellitus with unspecified complications: Secondary | ICD-10-CM | POA: Diagnosis not present

## 2018-03-22 DIAGNOSIS — R269 Unspecified abnormalities of gait and mobility: Secondary | ICD-10-CM | POA: Diagnosis not present

## 2018-03-22 DIAGNOSIS — R270 Ataxia, unspecified: Secondary | ICD-10-CM | POA: Diagnosis not present

## 2018-03-22 DIAGNOSIS — S72141D Displaced intertrochanteric fracture of right femur, subsequent encounter for closed fracture with routine healing: Secondary | ICD-10-CM | POA: Diagnosis not present

## 2018-03-22 DIAGNOSIS — Z96641 Presence of right artificial hip joint: Secondary | ICD-10-CM | POA: Diagnosis not present

## 2018-03-22 DIAGNOSIS — Z789 Other specified health status: Secondary | ICD-10-CM | POA: Diagnosis not present

## 2018-03-22 DIAGNOSIS — I10 Essential (primary) hypertension: Secondary | ICD-10-CM | POA: Diagnosis not present

## 2018-03-22 DIAGNOSIS — W1830XA Fall on same level, unspecified, initial encounter: Secondary | ICD-10-CM | POA: Diagnosis not present

## 2018-03-22 DIAGNOSIS — R2681 Unsteadiness on feet: Secondary | ICD-10-CM | POA: Diagnosis not present

## 2018-03-22 DIAGNOSIS — S72101E Unspecified trochanteric fracture of right femur, subsequent encounter for open fracture type I or II with routine healing: Secondary | ICD-10-CM | POA: Diagnosis not present

## 2018-03-22 DIAGNOSIS — E119 Type 2 diabetes mellitus without complications: Secondary | ICD-10-CM | POA: Diagnosis not present

## 2018-03-22 DIAGNOSIS — M6281 Muscle weakness (generalized): Secondary | ICD-10-CM | POA: Diagnosis not present

## 2018-03-22 DIAGNOSIS — F339 Major depressive disorder, recurrent, unspecified: Secondary | ICD-10-CM | POA: Diagnosis not present

## 2018-03-22 DIAGNOSIS — Z9181 History of falling: Secondary | ICD-10-CM | POA: Diagnosis not present

## 2018-03-22 DIAGNOSIS — Z4889 Encounter for other specified surgical aftercare: Secondary | ICD-10-CM | POA: Diagnosis not present

## 2018-03-22 DIAGNOSIS — R279 Unspecified lack of coordination: Secondary | ICD-10-CM | POA: Diagnosis not present

## 2018-03-22 DIAGNOSIS — F329 Major depressive disorder, single episode, unspecified: Secondary | ICD-10-CM | POA: Diagnosis not present

## 2018-03-22 DIAGNOSIS — G4700 Insomnia, unspecified: Secondary | ICD-10-CM | POA: Diagnosis not present

## 2018-04-24 ENCOUNTER — Other Ambulatory Visit: Payer: Self-pay

## 2018-04-24 NOTE — Patient Outreach (Signed)
[name]  04/24/2018  [name] [name] May 20, 1950 [id number]  Transition of care  Referral date: 04/24/18 Referral source: discharged from [facility] [place]: [employer]  Attempt #1  Telephone call to patient regarding transition of care referral. Unable to reach patient. Attempted call to patients listed home number.  Telephone line was busy. Attempted call to listed mobile number.  HIPAA compliant voice message left with call back phone number.   PLAN: RNCM will attempt 2nd telephone call to patient within 4 business days.  RNCm will send outreach letter to attempt contact   [name] RN,BSN,CCM [facility] Telephonic  [phone]

## 2018-04-25 DIAGNOSIS — Z4789 Encounter for other orthopedic aftercare: Secondary | ICD-10-CM | POA: Diagnosis not present

## 2018-04-26 ENCOUNTER — Other Ambulatory Visit: Payer: Self-pay

## 2018-04-26 NOTE — Patient Outreach (Signed)
[facility]) Care Management  04/26/2018  [name] [name] [birth year] [id number]  Transition of care  Referral date: 04/24/18 Referral source: discharged from [facility] [place]: [place] call to patient regarding transition of care referral. HIPAA verified with patient. Explained reason for call. Patient states she was in the nursing rehab facility receiving therapy for a repair of fractured right hip. Patient states she sustained a fall which resulted in the fracture of her hip.  Patient states she saw the surgeon on yesterday. Reports her incision is healing well. Patient states she is receiving home physical therapy with [facility] home health 2-3 times per week. Patient states she is using a walker for ambulation. Patient reports as of yesterday she is now 100% weight bearing on her right leg/hip.  RNCM discussed signs/ symptoms of infection with patient. Advised patient to call surgeon's office for infection like symptoms. Patient verbalized understanding. Patient states she has her medications and is taking them as prescribed.  Patient inquired if her therapy with [name] is going to be covered since she is out of the area. Patient states she is currently in [place] at her son's home. She states she fell while visiting in [place]. RNCM advised patient to call the customer service number on her [facility] card and inquire about her coverage. Patient states she is looking for a new primary MD provider in [place]. Patient states [employer] gave her the name of two providers but they are not taking patients until the end of May 2020.  RNCM advised patient to contact [employer] to request additional names for local providers. Patient verbalized understanding.  RNCM discussed COVID 19 precautions with patient.  RNCM advised patient to notify MD of any changes in condition prior to scheduled appointment. RNCM provided contact name and number: [phone] or main office number  [phone] and 24 hour nurse advise line [phone] by mail. [name] [name]  RNCM verified patient aware of 911 services for urgent/ emergent needs.  PLAN;  RNCM will close patient due to patient being assessed and having no further needs.  RNCM will send patients primary MD closure letter.   [name] RN,BSN,CCM [facility] Telephonic  [phone]

## 2018-05-20 DIAGNOSIS — R269 Unspecified abnormalities of gait and mobility: Secondary | ICD-10-CM | POA: Diagnosis not present

## 2018-06-13 DIAGNOSIS — M130 Polyarthritis, unspecified: Secondary | ICD-10-CM | POA: Diagnosis not present

## 2018-06-13 DIAGNOSIS — R609 Edema, unspecified: Secondary | ICD-10-CM | POA: Diagnosis not present

## 2018-06-13 DIAGNOSIS — I10 Essential (primary) hypertension: Secondary | ICD-10-CM | POA: Diagnosis not present

## 2018-06-13 DIAGNOSIS — R2243 Localized swelling, mass and lump, lower limb, bilateral: Secondary | ICD-10-CM | POA: Diagnosis not present

## 2018-06-18 DIAGNOSIS — I10 Essential (primary) hypertension: Secondary | ICD-10-CM | POA: Diagnosis not present

## 2018-06-18 DIAGNOSIS — K2970 Gastritis, unspecified, without bleeding: Secondary | ICD-10-CM | POA: Diagnosis not present

## 2018-06-18 DIAGNOSIS — E1169 Type 2 diabetes mellitus with other specified complication: Secondary | ICD-10-CM | POA: Diagnosis not present

## 2018-06-18 DIAGNOSIS — Z96659 Presence of unspecified artificial knee joint: Secondary | ICD-10-CM | POA: Diagnosis not present

## 2018-06-19 DIAGNOSIS — R269 Unspecified abnormalities of gait and mobility: Secondary | ICD-10-CM | POA: Diagnosis not present

## 2018-07-20 DIAGNOSIS — R269 Unspecified abnormalities of gait and mobility: Secondary | ICD-10-CM | POA: Diagnosis not present

## 2018-08-19 DIAGNOSIS — R269 Unspecified abnormalities of gait and mobility: Secondary | ICD-10-CM | POA: Diagnosis not present

## 2018-09-12 DIAGNOSIS — Z7189 Other specified counseling: Secondary | ICD-10-CM | POA: Diagnosis not present

## 2018-09-19 DIAGNOSIS — R269 Unspecified abnormalities of gait and mobility: Secondary | ICD-10-CM | POA: Diagnosis not present

## 2018-10-10 DIAGNOSIS — Z7189 Other specified counseling: Secondary | ICD-10-CM | POA: Diagnosis not present

## 2018-10-20 DIAGNOSIS — R269 Unspecified abnormalities of gait and mobility: Secondary | ICD-10-CM | POA: Diagnosis not present

## 2018-11-11 DIAGNOSIS — G894 Chronic pain syndrome: Secondary | ICD-10-CM | POA: Diagnosis not present

## 2018-11-11 DIAGNOSIS — I739 Peripheral vascular disease, unspecified: Secondary | ICD-10-CM | POA: Diagnosis not present

## 2018-11-11 DIAGNOSIS — G4700 Insomnia, unspecified: Secondary | ICD-10-CM | POA: Diagnosis not present

## 2018-11-11 DIAGNOSIS — Z9012 Acquired absence of left breast and nipple: Secondary | ICD-10-CM | POA: Diagnosis not present

## 2018-11-11 DIAGNOSIS — I10 Essential (primary) hypertension: Secondary | ICD-10-CM | POA: Diagnosis not present

## 2018-11-11 DIAGNOSIS — E119 Type 2 diabetes mellitus without complications: Secondary | ICD-10-CM | POA: Diagnosis not present

## 2018-11-11 DIAGNOSIS — Z90710 Acquired absence of both cervix and uterus: Secondary | ICD-10-CM | POA: Diagnosis not present

## 2018-11-11 DIAGNOSIS — M40209 Unspecified kyphosis, site unspecified: Secondary | ICD-10-CM | POA: Diagnosis not present

## 2018-11-11 DIAGNOSIS — Z96641 Presence of right artificial hip joint: Secondary | ICD-10-CM | POA: Diagnosis not present

## 2018-11-11 DIAGNOSIS — M810 Age-related osteoporosis without current pathological fracture: Secondary | ICD-10-CM | POA: Diagnosis not present

## 2018-11-11 DIAGNOSIS — F325 Major depressive disorder, single episode, in full remission: Secondary | ICD-10-CM | POA: Diagnosis not present

## 2018-11-11 DIAGNOSIS — Z96653 Presence of artificial knee joint, bilateral: Secondary | ICD-10-CM | POA: Diagnosis not present

## 2018-11-11 DIAGNOSIS — F17200 Nicotine dependence, unspecified, uncomplicated: Secondary | ICD-10-CM | POA: Diagnosis not present

## 2018-11-14 DIAGNOSIS — Z7189 Other specified counseling: Secondary | ICD-10-CM | POA: Diagnosis not present

## 2018-11-19 DIAGNOSIS — R269 Unspecified abnormalities of gait and mobility: Secondary | ICD-10-CM | POA: Diagnosis not present

## 2018-11-29 DIAGNOSIS — E1151 Type 2 diabetes mellitus with diabetic peripheral angiopathy without gangrene: Secondary | ICD-10-CM | POA: Diagnosis not present

## 2018-11-29 DIAGNOSIS — B351 Tinea unguium: Secondary | ICD-10-CM | POA: Diagnosis not present

## 2018-11-29 DIAGNOSIS — M2011 Hallux valgus (acquired), right foot: Secondary | ICD-10-CM | POA: Diagnosis not present

## 2018-12-17 DIAGNOSIS — Z7189 Other specified counseling: Secondary | ICD-10-CM | POA: Diagnosis not present

## 2018-12-18 DIAGNOSIS — M19011 Primary osteoarthritis, right shoulder: Secondary | ICD-10-CM | POA: Diagnosis not present

## 2018-12-18 DIAGNOSIS — M25511 Pain in right shoulder: Secondary | ICD-10-CM | POA: Diagnosis not present

## 2018-12-20 DIAGNOSIS — R269 Unspecified abnormalities of gait and mobility: Secondary | ICD-10-CM | POA: Diagnosis not present

## 2018-12-31 DIAGNOSIS — M7581 Other shoulder lesions, right shoulder: Secondary | ICD-10-CM | POA: Diagnosis not present

## 2018-12-31 DIAGNOSIS — M19011 Primary osteoarthritis, right shoulder: Secondary | ICD-10-CM | POA: Diagnosis not present

## 2019-01-07 DIAGNOSIS — S46011D Strain of muscle(s) and tendon(s) of the rotator cuff of right shoulder, subsequent encounter: Secondary | ICD-10-CM | POA: Diagnosis not present

## 2019-01-07 DIAGNOSIS — M25511 Pain in right shoulder: Secondary | ICD-10-CM | POA: Diagnosis not present

## 2019-01-19 DIAGNOSIS — R269 Unspecified abnormalities of gait and mobility: Secondary | ICD-10-CM | POA: Diagnosis not present

## 2019-01-21 DIAGNOSIS — Z7189 Other specified counseling: Secondary | ICD-10-CM | POA: Diagnosis not present

## 2019-01-22 DIAGNOSIS — M25511 Pain in right shoulder: Secondary | ICD-10-CM | POA: Diagnosis not present

## 2019-01-28 DIAGNOSIS — M25511 Pain in right shoulder: Secondary | ICD-10-CM | POA: Diagnosis not present

## 2019-02-06 DIAGNOSIS — M25561 Pain in right knee: Secondary | ICD-10-CM | POA: Diagnosis not present

## 2019-02-14 DIAGNOSIS — E119 Type 2 diabetes mellitus without complications: Secondary | ICD-10-CM | POA: Diagnosis not present

## 2019-02-14 DIAGNOSIS — E785 Hyperlipidemia, unspecified: Secondary | ICD-10-CM | POA: Diagnosis not present

## 2019-02-14 DIAGNOSIS — M25561 Pain in right knee: Secondary | ICD-10-CM | POA: Diagnosis not present

## 2019-02-14 DIAGNOSIS — I10 Essential (primary) hypertension: Secondary | ICD-10-CM | POA: Diagnosis not present

## 2019-02-14 DIAGNOSIS — Z0000 Encounter for general adult medical examination without abnormal findings: Secondary | ICD-10-CM | POA: Diagnosis not present

## 2019-02-19 DIAGNOSIS — R269 Unspecified abnormalities of gait and mobility: Secondary | ICD-10-CM | POA: Diagnosis not present

## 2019-02-19 DIAGNOSIS — K2970 Gastritis, unspecified, without bleeding: Secondary | ICD-10-CM | POA: Diagnosis not present

## 2019-02-19 DIAGNOSIS — E785 Hyperlipidemia, unspecified: Secondary | ICD-10-CM | POA: Diagnosis not present

## 2019-02-19 DIAGNOSIS — Z0001 Encounter for general adult medical examination with abnormal findings: Secondary | ICD-10-CM | POA: Diagnosis not present

## 2019-02-19 DIAGNOSIS — I10 Essential (primary) hypertension: Secondary | ICD-10-CM | POA: Diagnosis not present

## 2019-02-19 DIAGNOSIS — M19011 Primary osteoarthritis, right shoulder: Secondary | ICD-10-CM | POA: Diagnosis not present

## 2019-02-19 DIAGNOSIS — M25511 Pain in right shoulder: Secondary | ICD-10-CM | POA: Diagnosis not present

## 2019-02-21 DIAGNOSIS — E119 Type 2 diabetes mellitus without complications: Secondary | ICD-10-CM | POA: Diagnosis not present

## 2019-02-21 DIAGNOSIS — Z853 Personal history of malignant neoplasm of breast: Secondary | ICD-10-CM | POA: Diagnosis not present

## 2019-02-21 DIAGNOSIS — I509 Heart failure, unspecified: Secondary | ICD-10-CM | POA: Diagnosis not present

## 2019-02-21 DIAGNOSIS — B999 Unspecified infectious disease: Secondary | ICD-10-CM | POA: Diagnosis not present

## 2019-02-21 DIAGNOSIS — Z7984 Long term (current) use of oral hypoglycemic drugs: Secondary | ICD-10-CM | POA: Diagnosis not present

## 2019-02-21 DIAGNOSIS — R079 Chest pain, unspecified: Secondary | ICD-10-CM | POA: Diagnosis not present

## 2019-02-21 DIAGNOSIS — D649 Anemia, unspecified: Secondary | ICD-10-CM | POA: Diagnosis not present

## 2019-02-21 DIAGNOSIS — I110 Hypertensive heart disease with heart failure: Secondary | ICD-10-CM | POA: Diagnosis not present

## 2019-02-21 DIAGNOSIS — U071 COVID-19: Secondary | ICD-10-CM | POA: Diagnosis not present

## 2019-02-21 DIAGNOSIS — Z789 Other specified health status: Secondary | ICD-10-CM | POA: Diagnosis not present

## 2019-02-21 DIAGNOSIS — Z7982 Long term (current) use of aspirin: Secondary | ICD-10-CM | POA: Diagnosis not present

## 2019-02-21 DIAGNOSIS — R531 Weakness: Secondary | ICD-10-CM | POA: Diagnosis not present

## 2019-02-21 DIAGNOSIS — J1282 Pneumonia due to coronavirus disease 2019: Secondary | ICD-10-CM | POA: Diagnosis not present

## 2019-02-28 DIAGNOSIS — E119 Type 2 diabetes mellitus without complications: Secondary | ICD-10-CM | POA: Diagnosis not present

## 2019-02-28 DIAGNOSIS — I110 Hypertensive heart disease with heart failure: Secondary | ICD-10-CM | POA: Diagnosis not present

## 2019-02-28 DIAGNOSIS — Z853 Personal history of malignant neoplasm of breast: Secondary | ICD-10-CM | POA: Diagnosis not present

## 2019-02-28 DIAGNOSIS — J1282 Pneumonia due to coronavirus disease 2019: Secondary | ICD-10-CM | POA: Diagnosis not present

## 2019-02-28 DIAGNOSIS — Z7984 Long term (current) use of oral hypoglycemic drugs: Secondary | ICD-10-CM | POA: Diagnosis not present

## 2019-02-28 DIAGNOSIS — U071 COVID-19: Secondary | ICD-10-CM | POA: Diagnosis not present

## 2019-02-28 DIAGNOSIS — F17200 Nicotine dependence, unspecified, uncomplicated: Secondary | ICD-10-CM | POA: Diagnosis not present

## 2019-02-28 DIAGNOSIS — Z7982 Long term (current) use of aspirin: Secondary | ICD-10-CM | POA: Diagnosis not present

## 2019-02-28 DIAGNOSIS — I509 Heart failure, unspecified: Secondary | ICD-10-CM | POA: Diagnosis not present

## 2019-03-04 DIAGNOSIS — E119 Type 2 diabetes mellitus without complications: Secondary | ICD-10-CM | POA: Diagnosis not present

## 2019-03-04 DIAGNOSIS — Z853 Personal history of malignant neoplasm of breast: Secondary | ICD-10-CM | POA: Diagnosis not present

## 2019-03-04 DIAGNOSIS — I509 Heart failure, unspecified: Secondary | ICD-10-CM | POA: Diagnosis not present

## 2019-03-04 DIAGNOSIS — Z7984 Long term (current) use of oral hypoglycemic drugs: Secondary | ICD-10-CM | POA: Diagnosis not present

## 2019-03-04 DIAGNOSIS — U071 COVID-19: Secondary | ICD-10-CM | POA: Diagnosis not present

## 2019-03-04 DIAGNOSIS — I110 Hypertensive heart disease with heart failure: Secondary | ICD-10-CM | POA: Diagnosis not present

## 2019-03-04 DIAGNOSIS — Z7982 Long term (current) use of aspirin: Secondary | ICD-10-CM | POA: Diagnosis not present

## 2019-03-04 DIAGNOSIS — F17200 Nicotine dependence, unspecified, uncomplicated: Secondary | ICD-10-CM | POA: Diagnosis not present

## 2019-03-04 DIAGNOSIS — J1282 Pneumonia due to coronavirus disease 2019: Secondary | ICD-10-CM | POA: Diagnosis not present

## 2019-03-05 DIAGNOSIS — F17200 Nicotine dependence, unspecified, uncomplicated: Secondary | ICD-10-CM | POA: Diagnosis not present

## 2019-03-05 DIAGNOSIS — J1282 Pneumonia due to coronavirus disease 2019: Secondary | ICD-10-CM | POA: Diagnosis not present

## 2019-03-05 DIAGNOSIS — Z7982 Long term (current) use of aspirin: Secondary | ICD-10-CM | POA: Diagnosis not present

## 2019-03-05 DIAGNOSIS — E119 Type 2 diabetes mellitus without complications: Secondary | ICD-10-CM | POA: Diagnosis not present

## 2019-03-05 DIAGNOSIS — I110 Hypertensive heart disease with heart failure: Secondary | ICD-10-CM | POA: Diagnosis not present

## 2019-03-05 DIAGNOSIS — U071 COVID-19: Secondary | ICD-10-CM | POA: Diagnosis not present

## 2019-03-05 DIAGNOSIS — Z7984 Long term (current) use of oral hypoglycemic drugs: Secondary | ICD-10-CM | POA: Diagnosis not present

## 2019-03-05 DIAGNOSIS — Z853 Personal history of malignant neoplasm of breast: Secondary | ICD-10-CM | POA: Diagnosis not present

## 2019-03-05 DIAGNOSIS — I509 Heart failure, unspecified: Secondary | ICD-10-CM | POA: Diagnosis not present

## 2019-03-07 DIAGNOSIS — Z853 Personal history of malignant neoplasm of breast: Secondary | ICD-10-CM | POA: Diagnosis not present

## 2019-03-07 DIAGNOSIS — Z7984 Long term (current) use of oral hypoglycemic drugs: Secondary | ICD-10-CM | POA: Diagnosis not present

## 2019-03-07 DIAGNOSIS — I509 Heart failure, unspecified: Secondary | ICD-10-CM | POA: Diagnosis not present

## 2019-03-07 DIAGNOSIS — E119 Type 2 diabetes mellitus without complications: Secondary | ICD-10-CM | POA: Diagnosis not present

## 2019-03-07 DIAGNOSIS — J1282 Pneumonia due to coronavirus disease 2019: Secondary | ICD-10-CM | POA: Diagnosis not present

## 2019-03-07 DIAGNOSIS — Z7982 Long term (current) use of aspirin: Secondary | ICD-10-CM | POA: Diagnosis not present

## 2019-03-07 DIAGNOSIS — F17200 Nicotine dependence, unspecified, uncomplicated: Secondary | ICD-10-CM | POA: Diagnosis not present

## 2019-03-07 DIAGNOSIS — U071 COVID-19: Secondary | ICD-10-CM | POA: Diagnosis not present

## 2019-03-07 DIAGNOSIS — I110 Hypertensive heart disease with heart failure: Secondary | ICD-10-CM | POA: Diagnosis not present

## 2019-03-11 DIAGNOSIS — I509 Heart failure, unspecified: Secondary | ICD-10-CM | POA: Diagnosis not present

## 2019-03-11 DIAGNOSIS — U071 COVID-19: Secondary | ICD-10-CM | POA: Diagnosis not present

## 2019-03-11 DIAGNOSIS — Z7982 Long term (current) use of aspirin: Secondary | ICD-10-CM | POA: Diagnosis not present

## 2019-03-11 DIAGNOSIS — E119 Type 2 diabetes mellitus without complications: Secondary | ICD-10-CM | POA: Diagnosis not present

## 2019-03-11 DIAGNOSIS — Z853 Personal history of malignant neoplasm of breast: Secondary | ICD-10-CM | POA: Diagnosis not present

## 2019-03-11 DIAGNOSIS — Z7984 Long term (current) use of oral hypoglycemic drugs: Secondary | ICD-10-CM | POA: Diagnosis not present

## 2019-03-11 DIAGNOSIS — I110 Hypertensive heart disease with heart failure: Secondary | ICD-10-CM | POA: Diagnosis not present

## 2019-03-11 DIAGNOSIS — J1282 Pneumonia due to coronavirus disease 2019: Secondary | ICD-10-CM | POA: Diagnosis not present

## 2019-03-11 DIAGNOSIS — F17200 Nicotine dependence, unspecified, uncomplicated: Secondary | ICD-10-CM | POA: Diagnosis not present

## 2019-03-13 DIAGNOSIS — F17200 Nicotine dependence, unspecified, uncomplicated: Secondary | ICD-10-CM | POA: Diagnosis not present

## 2019-03-13 DIAGNOSIS — E119 Type 2 diabetes mellitus without complications: Secondary | ICD-10-CM | POA: Diagnosis not present

## 2019-03-13 DIAGNOSIS — I110 Hypertensive heart disease with heart failure: Secondary | ICD-10-CM | POA: Diagnosis not present

## 2019-03-13 DIAGNOSIS — I509 Heart failure, unspecified: Secondary | ICD-10-CM | POA: Diagnosis not present

## 2019-03-13 DIAGNOSIS — J1282 Pneumonia due to coronavirus disease 2019: Secondary | ICD-10-CM | POA: Diagnosis not present

## 2019-03-13 DIAGNOSIS — U071 COVID-19: Secondary | ICD-10-CM | POA: Diagnosis not present

## 2019-03-13 DIAGNOSIS — Z7982 Long term (current) use of aspirin: Secondary | ICD-10-CM | POA: Diagnosis not present

## 2019-03-13 DIAGNOSIS — Z853 Personal history of malignant neoplasm of breast: Secondary | ICD-10-CM | POA: Diagnosis not present

## 2019-03-13 DIAGNOSIS — Z7984 Long term (current) use of oral hypoglycemic drugs: Secondary | ICD-10-CM | POA: Diagnosis not present

## 2019-03-19 DIAGNOSIS — F17200 Nicotine dependence, unspecified, uncomplicated: Secondary | ICD-10-CM | POA: Diagnosis not present

## 2019-03-19 DIAGNOSIS — Z7982 Long term (current) use of aspirin: Secondary | ICD-10-CM | POA: Diagnosis not present

## 2019-03-19 DIAGNOSIS — E119 Type 2 diabetes mellitus without complications: Secondary | ICD-10-CM | POA: Diagnosis not present

## 2019-03-19 DIAGNOSIS — Z853 Personal history of malignant neoplasm of breast: Secondary | ICD-10-CM | POA: Diagnosis not present

## 2019-03-19 DIAGNOSIS — I110 Hypertensive heart disease with heart failure: Secondary | ICD-10-CM | POA: Diagnosis not present

## 2019-03-19 DIAGNOSIS — J1282 Pneumonia due to coronavirus disease 2019: Secondary | ICD-10-CM | POA: Diagnosis not present

## 2019-03-19 DIAGNOSIS — U071 COVID-19: Secondary | ICD-10-CM | POA: Diagnosis not present

## 2019-03-19 DIAGNOSIS — Z7984 Long term (current) use of oral hypoglycemic drugs: Secondary | ICD-10-CM | POA: Diagnosis not present

## 2019-03-19 DIAGNOSIS — I509 Heart failure, unspecified: Secondary | ICD-10-CM | POA: Diagnosis not present

## 2019-03-21 DIAGNOSIS — E119 Type 2 diabetes mellitus without complications: Secondary | ICD-10-CM | POA: Diagnosis not present

## 2019-03-21 DIAGNOSIS — U071 COVID-19: Secondary | ICD-10-CM | POA: Diagnosis not present

## 2019-03-21 DIAGNOSIS — F17200 Nicotine dependence, unspecified, uncomplicated: Secondary | ICD-10-CM | POA: Diagnosis not present

## 2019-03-21 DIAGNOSIS — J1282 Pneumonia due to coronavirus disease 2019: Secondary | ICD-10-CM | POA: Diagnosis not present

## 2019-03-21 DIAGNOSIS — Z7982 Long term (current) use of aspirin: Secondary | ICD-10-CM | POA: Diagnosis not present

## 2019-03-21 DIAGNOSIS — Z853 Personal history of malignant neoplasm of breast: Secondary | ICD-10-CM | POA: Diagnosis not present

## 2019-03-21 DIAGNOSIS — Z7984 Long term (current) use of oral hypoglycemic drugs: Secondary | ICD-10-CM | POA: Diagnosis not present

## 2019-03-21 DIAGNOSIS — I509 Heart failure, unspecified: Secondary | ICD-10-CM | POA: Diagnosis not present

## 2019-03-21 DIAGNOSIS — I110 Hypertensive heart disease with heart failure: Secondary | ICD-10-CM | POA: Diagnosis not present

## 2019-03-22 DIAGNOSIS — R269 Unspecified abnormalities of gait and mobility: Secondary | ICD-10-CM | POA: Diagnosis not present

## 2019-03-24 DIAGNOSIS — D649 Anemia, unspecified: Secondary | ICD-10-CM | POA: Diagnosis not present

## 2019-03-24 DIAGNOSIS — E119 Type 2 diabetes mellitus without complications: Secondary | ICD-10-CM | POA: Diagnosis not present

## 2019-03-24 DIAGNOSIS — E039 Hypothyroidism, unspecified: Secondary | ICD-10-CM | POA: Diagnosis not present

## 2019-03-24 DIAGNOSIS — I10 Essential (primary) hypertension: Secondary | ICD-10-CM | POA: Diagnosis not present

## 2019-03-24 DIAGNOSIS — Z8616 Personal history of COVID-19: Secondary | ICD-10-CM | POA: Diagnosis not present

## 2019-03-24 DIAGNOSIS — E1169 Type 2 diabetes mellitus with other specified complication: Secondary | ICD-10-CM | POA: Diagnosis not present

## 2019-03-24 DIAGNOSIS — Z09 Encounter for follow-up examination after completed treatment for conditions other than malignant neoplasm: Secondary | ICD-10-CM | POA: Diagnosis not present

## 2019-03-24 DIAGNOSIS — I110 Hypertensive heart disease with heart failure: Secondary | ICD-10-CM | POA: Diagnosis not present

## 2019-04-01 DIAGNOSIS — F17210 Nicotine dependence, cigarettes, uncomplicated: Secondary | ICD-10-CM | POA: Diagnosis not present

## 2019-04-01 DIAGNOSIS — R918 Other nonspecific abnormal finding of lung field: Secondary | ICD-10-CM | POA: Diagnosis not present

## 2019-04-08 DIAGNOSIS — I10 Essential (primary) hypertension: Secondary | ICD-10-CM | POA: Diagnosis not present

## 2019-04-08 DIAGNOSIS — R918 Other nonspecific abnormal finding of lung field: Secondary | ICD-10-CM | POA: Diagnosis not present

## 2019-04-08 DIAGNOSIS — E1165 Type 2 diabetes mellitus with hyperglycemia: Secondary | ICD-10-CM | POA: Diagnosis not present

## 2019-04-08 DIAGNOSIS — F339 Major depressive disorder, recurrent, unspecified: Secondary | ICD-10-CM | POA: Diagnosis not present

## 2019-04-09 DIAGNOSIS — M25569 Pain in unspecified knee: Secondary | ICD-10-CM | POA: Diagnosis not present

## 2019-04-09 DIAGNOSIS — M25519 Pain in unspecified shoulder: Secondary | ICD-10-CM | POA: Diagnosis not present

## 2019-04-09 DIAGNOSIS — G894 Chronic pain syndrome: Secondary | ICD-10-CM | POA: Diagnosis not present

## 2019-04-09 DIAGNOSIS — Z79891 Long term (current) use of opiate analgesic: Secondary | ICD-10-CM | POA: Diagnosis not present

## 2019-04-19 DIAGNOSIS — R269 Unspecified abnormalities of gait and mobility: Secondary | ICD-10-CM | POA: Diagnosis not present

## 2019-05-03 ENCOUNTER — Ambulatory Visit: Payer: Medicare PPO | Attending: Internal Medicine

## 2019-05-03 ENCOUNTER — Ambulatory Visit: Payer: Medicare PPO

## 2019-05-03 DIAGNOSIS — Z23 Encounter for immunization: Secondary | ICD-10-CM

## 2019-05-03 NOTE — Progress Notes (Signed)
   Covid-19 Vaccination Clinic  Name:  [name] [name]    MRN: [id number] DOB: 01-25-1950  05/03/2019  [name] [name] was observed post Covid-19 immunization for 15 minutes without incident. She was provided with Vaccine Information Sheet and instruction to access the V-Safe system.   [name] [name] was instructed to call 911 with any severe reactions post vaccine: Marland Kitchen Difficulty breathing  . Swelling of face and throat  . A fast heartbeat  . A bad rash all over body  . Dizziness and weakness   Immunizations Administered    Name Date Dose VIS Date Route   Moderna COVID-19 Vaccine 05/03/2019  9:17 AM 0.5 mL 12/24/2018 Intramuscular   Manufacturer: Moderna   Lot: [id number]   [place]:[id number]

## 2019-05-06 DIAGNOSIS — E785 Hyperlipidemia, unspecified: Secondary | ICD-10-CM | POA: Diagnosis not present

## 2019-05-06 DIAGNOSIS — E1169 Type 2 diabetes mellitus with other specified complication: Secondary | ICD-10-CM | POA: Diagnosis not present

## 2019-05-06 DIAGNOSIS — M1000 Idiopathic gout, unspecified site: Secondary | ICD-10-CM | POA: Diagnosis not present

## 2019-05-06 DIAGNOSIS — G8929 Other chronic pain: Secondary | ICD-10-CM | POA: Diagnosis not present

## 2019-05-06 DIAGNOSIS — I10 Essential (primary) hypertension: Secondary | ICD-10-CM | POA: Diagnosis not present

## 2019-05-06 DIAGNOSIS — M109 Gout, unspecified: Secondary | ICD-10-CM | POA: Diagnosis not present

## 2019-05-06 DIAGNOSIS — E781 Pure hyperglyceridemia: Secondary | ICD-10-CM | POA: Diagnosis not present

## 2019-05-31 ENCOUNTER — Ambulatory Visit: Payer: Medicare PPO

## 2019-08-25 ENCOUNTER — Ambulatory Visit: Payer: Medicare Other | Admitting: Podiatry

## 2019-08-27 ENCOUNTER — Ambulatory Visit (INDEPENDENT_AMBULATORY_CARE_PROVIDER_SITE_OTHER): Payer: Medicare PPO

## 2019-08-27 ENCOUNTER — Other Ambulatory Visit: Payer: Self-pay

## 2019-08-27 ENCOUNTER — Ambulatory Visit (INDEPENDENT_AMBULATORY_CARE_PROVIDER_SITE_OTHER): Payer: Medicare PPO | Admitting: Podiatry

## 2019-08-27 DIAGNOSIS — M19072 Primary osteoarthritis, left ankle and foot: Secondary | ICD-10-CM

## 2019-08-27 DIAGNOSIS — M2042 Other hammer toe(s) (acquired), left foot: Secondary | ICD-10-CM

## 2019-08-27 DIAGNOSIS — M21611 Bunion of right foot: Secondary | ICD-10-CM | POA: Diagnosis not present

## 2019-08-27 DIAGNOSIS — M21612 Bunion of left foot: Secondary | ICD-10-CM

## 2019-08-27 DIAGNOSIS — E1142 Type 2 diabetes mellitus with diabetic polyneuropathy: Secondary | ICD-10-CM

## 2019-08-27 DIAGNOSIS — M2012 Hallux valgus (acquired), left foot: Secondary | ICD-10-CM

## 2019-08-27 DIAGNOSIS — B351 Tinea unguium: Secondary | ICD-10-CM

## 2019-08-27 DIAGNOSIS — M2011 Hallux valgus (acquired), right foot: Secondary | ICD-10-CM

## 2019-08-27 DIAGNOSIS — Z945 Skin transplant status: Secondary | ICD-10-CM

## 2019-08-27 DIAGNOSIS — M79671 Pain in right foot: Secondary | ICD-10-CM

## 2019-08-27 DIAGNOSIS — M2041 Other hammer toe(s) (acquired), right foot: Secondary | ICD-10-CM

## 2019-08-27 DIAGNOSIS — M79674 Pain in right toe(s): Secondary | ICD-10-CM

## 2019-08-27 DIAGNOSIS — M79675 Pain in left toe(s): Secondary | ICD-10-CM

## 2019-08-27 NOTE — Progress Notes (Signed)
Subjective:  Patient ID: [name] [name], female    DOB: January 14, 1951,  MRN: [id number]  Chief Complaint  Patient presents with  . Bunions    painful bunions on both feet, worsening for about 1 year  . Diabetes    diabetic foot exam  . Callouses    painful callus on bottom of heel - had a machine run over her heel as a child and had to have a skin graft  . Nail Problem    nail fungus    69 y.o. female presents with the above complaint. History confirmed with patient.  Callus on the bottom of the heel is on a skin flap that she had placed when she was a young child after a crush injury from a machine running over her foot.  The bunions have been worsening and are painful for her.  Objective:  Physical Exam: warm, good capillary refill, no trophic changes or ulcerative lesions, normal DP and PT pulses and reduced sensation at distal pulps of toes. Left Foot: Large plantar pedicle flap that is well-healed with a central callus present on the plantar heel.  Severe hallux valgus and hallux rigidus with semirigid digital contractures 2 through 5.  Onychomycosis x5 Right Foot: Severe hallux valgus with the dorsal medial bunion and semirigid digital contractures 2 through 5.  Onychomycosis x5  Radiographs: X-ray of both feet: Severe hallux valgus bilaterally.  Hammertoe contractures bilaterally.  Deformity of the posterior calcaneus from previous injury.  Assessment:   1. Hallux valgus with bunions of right foot   2. Pain in right foot   3. Hallux valgus with bunions, left   4. Hammertoe of left foot   5. Hammertoe of right foot   6. Type 2 diabetes mellitus with peripheral neuropathy (HCC)   7. Onychomycosis   8. Pain due to onychomycosis of toenails of both feet   9. History of skin graft      Plan:  Patient was evaluated and treated and all questions answered.   Patient educated on diabetes. Discussed proper diabetic foot care and discussed risks and complications of  disease. Educated patient in depth on reasons to return to the office immediately should he/she discover anything concerning or new on the feet. All questions answered. Discussed proper shoes as well.   Discussed the etiology and treatment options for the condition in detail with the patient. Educated patient on the topical and oral treatment options for mycotic nails. Recommended debridement of the nails today. Sharp and mechanical debridement performed of all painful and mycotic nails today. Nails debrided in length and thickness using a nail nipper and a mechanical burr to level of comfort. Discussed treatment options including appropriate shoe gear. Follow up as needed for painful nails.  All symptomatic hyperkeratoses were safely debrided with a sterile #15 blade to patient's level of comfort without incident. We discussed preventative and palliative care of these lesions including supportive and accommodative shoegear, padding, prefabricated and custom molded accommodative orthoses, use of a pumice stone and lotions/creams daily.  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  I recommended that she be fitted for diabetic shoes and inserts.  I think given her medical history and age that bunionectomy would be difficult to reconstruct.  Keller arthroplasty is an option.  We will discuss this at her next visit.  Return in about 3 months (around 11/27/2019) for [facility].

## 2019-08-28 ENCOUNTER — Other Ambulatory Visit: Payer: Self-pay | Admitting: Podiatry

## 2019-08-28 DIAGNOSIS — M2011 Hallux valgus (acquired), right foot: Secondary | ICD-10-CM

## 2019-08-28 DIAGNOSIS — M2012 Hallux valgus (acquired), left foot: Secondary | ICD-10-CM
